# Patient Record
Sex: Male | Born: 1995
Health system: Southern US, Community
[De-identification: ages and names within clinical notes are randomized; demographics above are authoritative.]

## PROBLEM LIST (undated history)

## (undated) DIAGNOSIS — F419 Anxiety disorder, unspecified: Secondary | ICD-10-CM

## (undated) DIAGNOSIS — N200 Calculus of kidney: Secondary | ICD-10-CM

## (undated) HISTORY — DX: Anxiety disorder, unspecified: F41.9

## (undated) HISTORY — PX: WISDOM TOOTH EXTRACTION: SHX21

---
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
## 2016-05-15 ENCOUNTER — Emergency Department (INDEPENDENT_AMBULATORY_CARE_PROVIDER_SITE_OTHER)
Admission: EM | Admit: 2016-05-15 | Discharge: 2016-05-15 | Disposition: A | Discharge status: Home / Self Care | Payer: Self-pay | Source: Home / Self Care | Attending: Family Medicine | Admitting: Family Medicine

## 2016-05-15 ENCOUNTER — Telehealth: Payer: Self-pay | Admitting: *Deleted

## 2016-05-15 ENCOUNTER — Encounter: Payer: Self-pay | Admitting: *Deleted

## 2016-05-15 DIAGNOSIS — R300 Dysuria: Secondary | ICD-10-CM

## 2016-05-15 LAB — POCT URINALYSIS DIP (MANUAL ENTRY)
Glucose, UA: NEGATIVE
Ketones, POC UA: NEGATIVE
Leukocytes, UA: NEGATIVE
Nitrite, UA: NEGATIVE
PROTEIN UA: NEGATIVE
RBC UA: NEGATIVE
Spec Grav, UA: 1.025 (ref 1.005–1.03)
UROBILINOGEN UA: 0.2 (ref 0–1)
pH, UA: 5.5 (ref 5–8)

## 2016-05-15 MED ORDER — SULFAMETHOXAZOLE-TRIMETHOPRIM 800-160 MG PO TABS: 1.0000 | ORAL_TABLET | Freq: Two times a day (BID) | ORAL | 0 refills | Status: DC

## 2016-05-15 MED ORDER — CIPROFLOXACIN HCL 500 MG PO TABS: 500.0000 mg | ORAL_TABLET | Freq: Two times a day (BID) | ORAL | 0 refills | Status: DC

## 2016-05-15 NOTE — Discharge Instructions (Signed)
Increase fluid intake. May use non-prescription AZO for about two days, if desired, to decrease urinary discomfort.  If symptoms become significantly worse during the night or over the weekend, proceed to the local emergency room.  

## 2016-05-15 NOTE — ED Triage Notes (Signed)
Patient c/o 2 days of dysuria and polyuria x 2 days. He initially had flank pain that has now resolved. Taken tylenol otc. H/o kidney stones x 1. Denies concern for STD as it was 6 months ago he was sexually active.

## 2016-05-15 NOTE — Telephone Encounter (Signed)
Pt called reports that he realized that he was allergic to bactrim when he was little and request a different ABT. Per dr Cathren HarshBeese sent in Rx for Cipro. Pt notified.

## 2016-05-15 NOTE — ED Provider Notes (Signed)
[facility]    CSN: [id number] Arrival date & time: 05/15/16  1059     History   Chief Complaint Chief Complaint  Patient presents with  . Dysuria  . Polyuria    HPI Timothy Montes is a 21 y.o. male.   Patient complains of two day history of urinary frequency and mild dysuria, without urethral discharge.  He has noted some mild tenderness in his right testicle.  No abdominal pain.  No fevers, chills, and sweats.  He states that he has not been sexually active for about 6 months, and is not concerned about a possible STD.  He initially had left side left flank pain one week ago that resolved.  He has past history of kidney stone about 1.5 years ago.     The history is provided by the patient.  Dysuria  This is a new problem. The current episode started 2 days ago. The problem occurs constantly. The problem has not changed since onset.Pertinent negatives include no abdominal pain. Nothing aggravates the symptoms. Nothing relieves the symptoms. He has tried nothing for the symptoms.    History reviewed. No pertinent past medical history.  There are no active problems to display for this patient.   History reviewed. No pertinent surgical history.     Home Medications    Prior to Admission medications   Medication Sig Start Date End Date Taking? Authorizing Provider  FLUoxetine (PROZAC) 10 MG tablet Take 10 mg by mouth daily.   Yes Historical Provider, MD  omeprazole (PRILOSEC) 20 MG capsule Take 20 mg by mouth daily.   Yes Historical Provider, MD  sulfamethoxazole-trimethoprim (BACTRIM DS,SEPTRA DS) 800-160 MG tablet Take 1 tablet by mouth 2 (two) times daily. 05/15/16   Lattie Haw, MD    Family History Family History  Problem Relation Age of Onset  . Heart attack Other     Social History Social History  Substance Use Topics  . Smoking status: Current Every Day Smoker    Packs/day: 0.50  . Smokeless tobacco: Never Used  . Alcohol use No      Allergies   Patient has no known allergies.   Review of Systems Review of Systems  Constitutional: Negative for appetite change, chills, diaphoresis, fatigue and fever.  Gastrointestinal: Negative for abdominal pain.  Genitourinary: Positive for dysuria, flank pain, frequency and testicular pain. Negative for difficulty urinating, discharge, genital sores, hematuria, penile pain, penile swelling, scrotal swelling and urgency.  Skin: Negative for rash.  All other systems reviewed and are negative.    Physical Exam Triage Vital Signs ED Triage Vitals  Enc Vitals Group     BP 05/15/16 1133 128/82     Pulse Rate 05/15/16 1133 89     Resp --      Temp 05/15/16 1133 98 F (36.7 C)     Temp Source 05/15/16 1133 Oral     SpO2 05/15/16 1133 97 %     Weight 05/15/16 1133 207 lb (93.9 kg)     Height --      Head Circumference --      Peak Flow --      Pain Score 05/15/16 1137 0     Pain Loc --      Pain Edu? --      Excl. in GC? --    No data found.   Updated Vital Signs BP 128/82 (BP Location: Left Arm)   Pulse 89   Temp 98 F (36.7 C) (Oral)  Wt 207 lb (93.9 kg)   SpO2 97%   BMI 28.07 kg/m   Visual Acuity Right Eye Distance:   Left Eye Distance:   Bilateral Distance:    Right Eye Near:   Left Eye Near:    Bilateral Near:     Physical Exam Nursing notes and Vital Signs reviewed. Appearance:  Patient appears stated age, and in no acute distress Eyes:  Pupils are equal, round, and reactive to light and accomodation.  Extraocular movement is intact.  Conjunctivae are not inflamed  Ears:  Normal externally. Nose: Normal.    Pharynx:  Normal Neck:  Supple.   No adenopathy. Lungs:  Clear to auscultation.  Breath sounds are equal.  Moving air well. Heart:  Regular rate and rhythm without murmurs, rubs, or gallops.  Abdomen:  Nontender without masses or hepatosplenomegaly.  Bowel sounds are present.  No CVA or flank tenderness.  Extremities:  No edema.   Skin:  No rash present.   Genitourinary:  Penis normal without lesions or urethral discharge.  Scrotum is normal.  Testes are descended bilaterally without nodules or tenderness.  No hernias are palpated.  No regional lymphadenopathy palpated  UC Treatments / Results  Labs (all labs ordered are listed, but only abnormal results are displayed) Labs Reviewed  POCT URINALYSIS DIP (MANUAL ENTRY) - Abnormal; Notable for the following:       Result Value   Bilirubin, UA small (*)    All other components within normal limits  URINE CULTURE    EKG  EKG Interpretation None       Radiology No results found.  Procedures Procedures (including critical care time)  Medications Ordered in UC Medications - No data to display   Initial Impression / Assessment and Plan / UC Course  I have reviewed the triage vital signs and the nursing notes.  Pertinent labs & imaging results that were available during my care of the patient were reviewed by me and considered in my medical decision making (see chart for details).  Clinical Course   Urine culture pending. Normal exam reassuring. Begin empiric Bactrim DS BID Increase fluid intake.  May use non-prescription AZO for about two days, if desired, to decrease urinary discomfort.  If symptoms become significantly worse during the night or over the weekend, proceed to the local emergency room.  Followup with Family Doctor if not improved in one week.      Final Clinical Impressions(s) / UC Diagnoses   Final diagnoses:  Dysuria    New Prescriptions New Prescriptions   SULFAMETHOXAZOLE-TRIMETHOPRIM (BACTRIM DS,SEPTRA DS) 800-160 MG TABLET    Take 1 tablet by mouth 2 (two) times daily.     Lattie HawStephen A Makaylie Dedeaux, MD 06/04/16 2106

## 2016-05-16 ENCOUNTER — Emergency Department (INDEPENDENT_AMBULATORY_CARE_PROVIDER_SITE_OTHER)
Admission: EM | Admit: 2016-05-16 | Discharge: 2016-05-16 | Disposition: A | Discharge status: Home / Self Care | Payer: Self-pay | Source: Home / Self Care | Attending: Family Medicine | Admitting: Family Medicine

## 2016-05-16 DIAGNOSIS — R21 Rash and other nonspecific skin eruption: Secondary | ICD-10-CM

## 2016-05-16 LAB — URINE CULTURE: ORGANISM ID, BACTERIA: NO GROWTH

## 2016-05-16 MED ORDER — CLOTRIMAZOLE 1 % EX CREA: TOPICAL_CREAM | CUTANEOUS | 1 refills | Status: DC

## 2016-05-16 NOTE — ED Triage Notes (Signed)
Timothy Montes was seen here at the Urgent Care yesterday with dysuria. Today he noticed a rash in his groin area. The rash is reported as itchy, denies pain. He does have pain at the end of her penis and when he urinates.

## 2016-05-16 NOTE — ED Provider Notes (Signed)
CSN: [id number]     Arrival date & time 05/16/16  1537 History   First MD Initiated Contact with Patient 05/16/16 1549     Chief Complaint  Patient presents with  . Rash    x 1 day   (Consider location/radiation/quality/duration/timing/severity/associated sxs/prior Treatment) HPI Timothy Montes is a 21 y.o. male presenting to UC with c/o itching burning rash at the tip of his penis as well as bilateral groin that started last night and this morning. [facility], dx with a UTI, and discharged with Cipro as urine culture is pending.  Denies abdominal pain, n/v/d. Denies concern for STDs. Denies new soaps or lotions.   No past medical history on file. No past surgical history on file. Family History  Problem Relation Age of Onset  . Heart attack Other    Social History  Substance Use Topics  . Smoking status: Current Every Day Smoker    Packs/day: 0.50  . Smokeless tobacco: Never Used  . Alcohol use No    Review of Systems  Gastrointestinal: Negative for abdominal pain, diarrhea, nausea and vomiting.  Genitourinary: Positive for dysuria and penile pain. Negative for discharge, frequency, hematuria and urgency.  Musculoskeletal: Negative for back pain and myalgias.  Skin: Positive for color change and rash. Negative for wound.    Allergies  Patient has no known allergies.  Home Medications   Prior to Admission medications   Medication Sig Start Date End Date Taking? Authorizing Provider  ciprofloxacin (CIPRO) 500 MG tablet Take 1 tablet (500 mg total) by mouth 2 (two) times daily. 05/15/16  Yes Lattie HawStephen A Beese, MD  FLUoxetine (PROZAC) 10 MG tablet Take 10 mg by mouth daily.   Yes Historical Provider, MD  omeprazole (PRILOSEC) 20 MG capsule Take 20 mg by mouth daily.   Yes Historical Provider, MD  clotrimazole (LOTRIMIN) 1 % cream Apply to affected area 2 times daily for up to 4 weeks 05/16/16   Junius FinnerErin O'Malley, PA-C   Meds Ordered and Administered this Visit   Medications - No data to display  BP 137/88 (BP Location: Left Arm)   Pulse 110   Temp 98.5 F (36.9 C) (Oral)   Ht 6' (1.829 m)   Wt 207 lb (93.9 kg)   SpO2 95%   BMI 28.07 kg/m  No data found.   Physical Exam  Constitutional: He is oriented to person, place, and time. He appears well-developed and well-nourished.  HENT:  Head: Normocephalic and atraumatic.  Eyes: EOM are normal.  Neck: Normal range of motion.  Cardiovascular: Normal rate.   Pulmonary/Chest: Effort normal.  Genitourinary: Right testis shows no mass, no swelling and no tenderness. Left testis shows no mass, no swelling and no tenderness. Penile erythema present. No discharge found.  Genitourinary Comments: Erythematous rash in Left and Right inguinal folds and tip of penis. No penile discharge.  Musculoskeletal: Normal range of motion.  Neurological: He is alert and oriented to person, place, and time.  Skin: Skin is warm and dry. Rash ( see GU exam) noted.  Psychiatric: He has a normal mood and affect. His behavior is normal.  Nursing note and vitals reviewed.   Urgent Care Course   Clinical Course     Procedures (including critical care time)  Labs Review Labs Reviewed - No data to display  Imaging Review No results found.   MDM   1. Rash of groin    Pt c/o itching burning rash for 1 day. Exam  c/w tinea cruris.  Rx: clotrimazole cream F/u with PCP in 1 week if not improving.     Junius Finner, PA-C 05/16/16 1712

## 2016-05-17 NOTE — Telephone Encounter (Signed)
Callback: Patient reports he is starting to improve. UCX result given and discussed.

## 2017-06-25 ENCOUNTER — Emergency Department (INDEPENDENT_AMBULATORY_CARE_PROVIDER_SITE_OTHER)
Admission: EM | Admit: 2017-06-25 | Discharge: 2017-06-25 | Disposition: A | Discharge status: Home / Self Care | Payer: BLUE CROSS/BLUE SHIELD | Source: Home / Self Care | Attending: Family Medicine | Admitting: Family Medicine

## 2017-06-25 ENCOUNTER — Encounter: Payer: Self-pay | Admitting: Emergency Medicine

## 2017-06-25 ENCOUNTER — Other Ambulatory Visit: Payer: Self-pay

## 2017-06-25 DIAGNOSIS — R69 Illness, unspecified: Secondary | ICD-10-CM | POA: Diagnosis not present

## 2017-06-25 DIAGNOSIS — J111 Influenza due to unidentified influenza virus with other respiratory manifestations: Secondary | ICD-10-CM

## 2017-06-25 MED ORDER — OSELTAMIVIR PHOSPHATE 75 MG PO CAPS: 75.0000 mg | ORAL_CAPSULE | Freq: Two times a day (BID) | ORAL | 0 refills | Status: DC

## 2017-06-25 NOTE — ED Triage Notes (Addendum)
Cough, sore throat, fever x 3 days 

## 2017-06-25 NOTE — ED Provider Notes (Signed)
[facility]    CSN: [id number] Arrival date & time: 06/25/17  1111     History   Chief Complaint Chief Complaint  Patient presents with  . Sore Throat    HPI Timothy Montes is a 22 y.o. male.   Complains of 2 day history flu-like illness including fever 101/chills, fatigue, sore throat, sinus congestion, and cough. Cough is non-productive and somewhat worse at night.  No pleuritic pain or shortness of breath.    The history is provided by the patient.    History reviewed. No pertinent past medical history.  There are no active problems to display for this patient.   History reviewed. No pertinent surgical history.     Home Medications    Prior to Admission medications   Medication Sig Start Date End Date Taking? Authorizing Provider  FLUoxetine (PROZAC) 10 MG tablet Take 10 mg by mouth daily.    [provider]  oseltamivir (TAMIFLU) 75 MG capsule Take 1 capsule (75 mg total) by mouth every 12 (twelve) hours. 06/25/17   Lattie HawBeese, Stephen A, MD    Family History Family History  Problem Relation Age of Onset  . Heart attack Other     Social History Social History   Tobacco Use  . Smoking status: Former Smoker    Packs/day: 0.00  . Smokeless tobacco: Never Used  Substance Use Topics  . Alcohol use: No  . Drug use: No     Allergies   Sulfa antibiotics   Review of Systems Review of Systems + sore throat + cough No pleuritic pain No wheezing + nasal congestion + post-nasal drainage No sinus pain/pressure No itchy/red eyes ? earache No hemoptysis No SOB No fever, + chills No nausea No vomiting No abdominal pain No diarrhea No urinary symptoms No skin rash + fatigue No myalgias + headache Used OTC meds without relief   Physical Exam Triage Vital Signs ED Triage Vitals  Enc Vitals Group     BP 06/25/17 1224 128/78     Pulse Rate 06/25/17 1224 (!) 116     Resp --      Temp 06/25/17 1224 98.9 F (37.2 C)   Temp Source 06/25/17 1224 Oral     SpO2 06/25/17 1224 96 %     Weight 06/25/17 1230 209 lb (94.8 kg)     Height 06/25/17 1230 6' (1.829 m)     Head Circumference --      Peak Flow --      Pain Score --      Pain Loc --      Pain Edu? --      Excl. in GC? --    No data found.  Updated Vital Signs BP 128/78 (BP Location: Right Arm)   Pulse (!) 116   Temp 98.9 F (37.2 C) (Oral)   Ht 6' (1.829 m)   Wt 209 lb (94.8 kg)   SpO2 96%   BMI 28.35 kg/m   Visual Acuity Right Eye Distance:   Left Eye Distance:   Bilateral Distance:    Right Eye Near:   Left Eye Near:    Bilateral Near:     Physical Exam Nursing notes and Vital Signs reviewed. Appearance:  Patient appears stated age, and in no acute distress Eyes:  Pupils are equal, round, and reactive to light and accomodation.  Extraocular movement is intact.  Conjunctivae are not inflamed  Ears:  Canals normal.  Tympanic membranes normal.  Nose:  Mildly congested  turbinates.  No sinus tenderness.  Pharynx:  Normal Neck:  Supple.  Enlarged posterior/lateral nodes are palpated bilaterally, tender to palpation on the left.   Lungs:  Clear to auscultation.  Breath sounds are equal.  Moving air well. Heart:  Regular rate and rhythm without murmurs, rubs, or gallops.  Abdomen:  Nontender without masses or hepatosplenomegaly.  Bowel sounds are present.  No CVA or flank tenderness.  Extremities:  No edema.  Skin:  No rash present.    UC Treatments / Results  Labs (all labs ordered are listed, but only abnormal results are displayed) Labs Reviewed - No data to display  EKG  EKG Interpretation None       Radiology No results found.  Procedures Procedures (including critical care time)  Medications Ordered in UC Medications - No data to display   Initial Impression / Assessment and Plan / UC Course  I have reviewed the triage vital signs and the nursing notes.  Pertinent labs & imaging results that were available  during my care of the patient were reviewed by me and considered in my medical decision making (see chart for details).    Begin Tamiflu. Take plain guaifenesin (1200mg  extended release tabs such as Mucinex) twice daily, with plenty of water, for cough and congestion.  May add Pseudoephedrine (30mg , one or two every 4 to 6 hours) for sinus congestion.  Get adequate rest.   May use Afrin nasal spray (or generic oxymetazoline) each morning for about 5 days and then discontinue.  Also recommend using saline nasal spray several times daily and saline nasal irrigation (AYR is a common brand).  Use Flonase nasal spray each morning after using Afrin nasal spray and saline nasal irrigation. Try warm salt water gargles for sore throat.  Stop all antihistamines for now, and other non-prescription cough/cold preparations. May take Ibuprofen 200mg , 4 tabs every 8 hours with food for body aches, fever, etc. May take Delsym Cough Suppressant at bedtime for nighttime cough.  Followup with Family Doctor if not improved in one week.     Final Clinical Impressions(s) / UC Diagnoses   Final diagnoses:  Influenza-like illness    ED Discharge Orders        Ordered    oseltamivir (TAMIFLU) 75 MG capsule  Every 12 hours     06/25/17 1318          Lattie Haw, MD 07/06/17 1236

## 2017-06-25 NOTE — Discharge Instructions (Signed)
Take plain guaifenesin (1200mg extended release tabs such as Mucinex) twice daily, with plenty of water, for cough and congestion.  May add Pseudoephedrine (30mg, one or two every 4 to 6 hours) for sinus congestion.  Get adequate rest.   °May use Afrin nasal spray (or generic oxymetazoline) each morning for about 5 days and then discontinue.  Also recommend using saline nasal spray several times daily and saline nasal irrigation (AYR is a common brand).  Use Flonase nasal spray each morning after using Afrin nasal spray and saline nasal irrigation. °Try warm salt water gargles for sore throat.  °Stop all antihistamines for now, and other non-prescription cough/cold preparations. °May take Ibuprofen 200mg, 4 tabs every 8 hours with food for body aches, fever, etc. °May take Delsym Cough Suppressant at bedtime for nighttime cough.  °

## 2017-12-19 IMAGING — DX DG ABDOMEN 1V
2 series · 2 of 2 positions shown · non-contrast
Comparison: No priors.

CLINICAL DATA: 19-year-old male with constipation for the past 5
days. Distended abdomen. Abdominal pain. Decreased appetite.

EXAM:
ABDOMEN - 1 VIEW

[abdomen kub (1 of 2)]
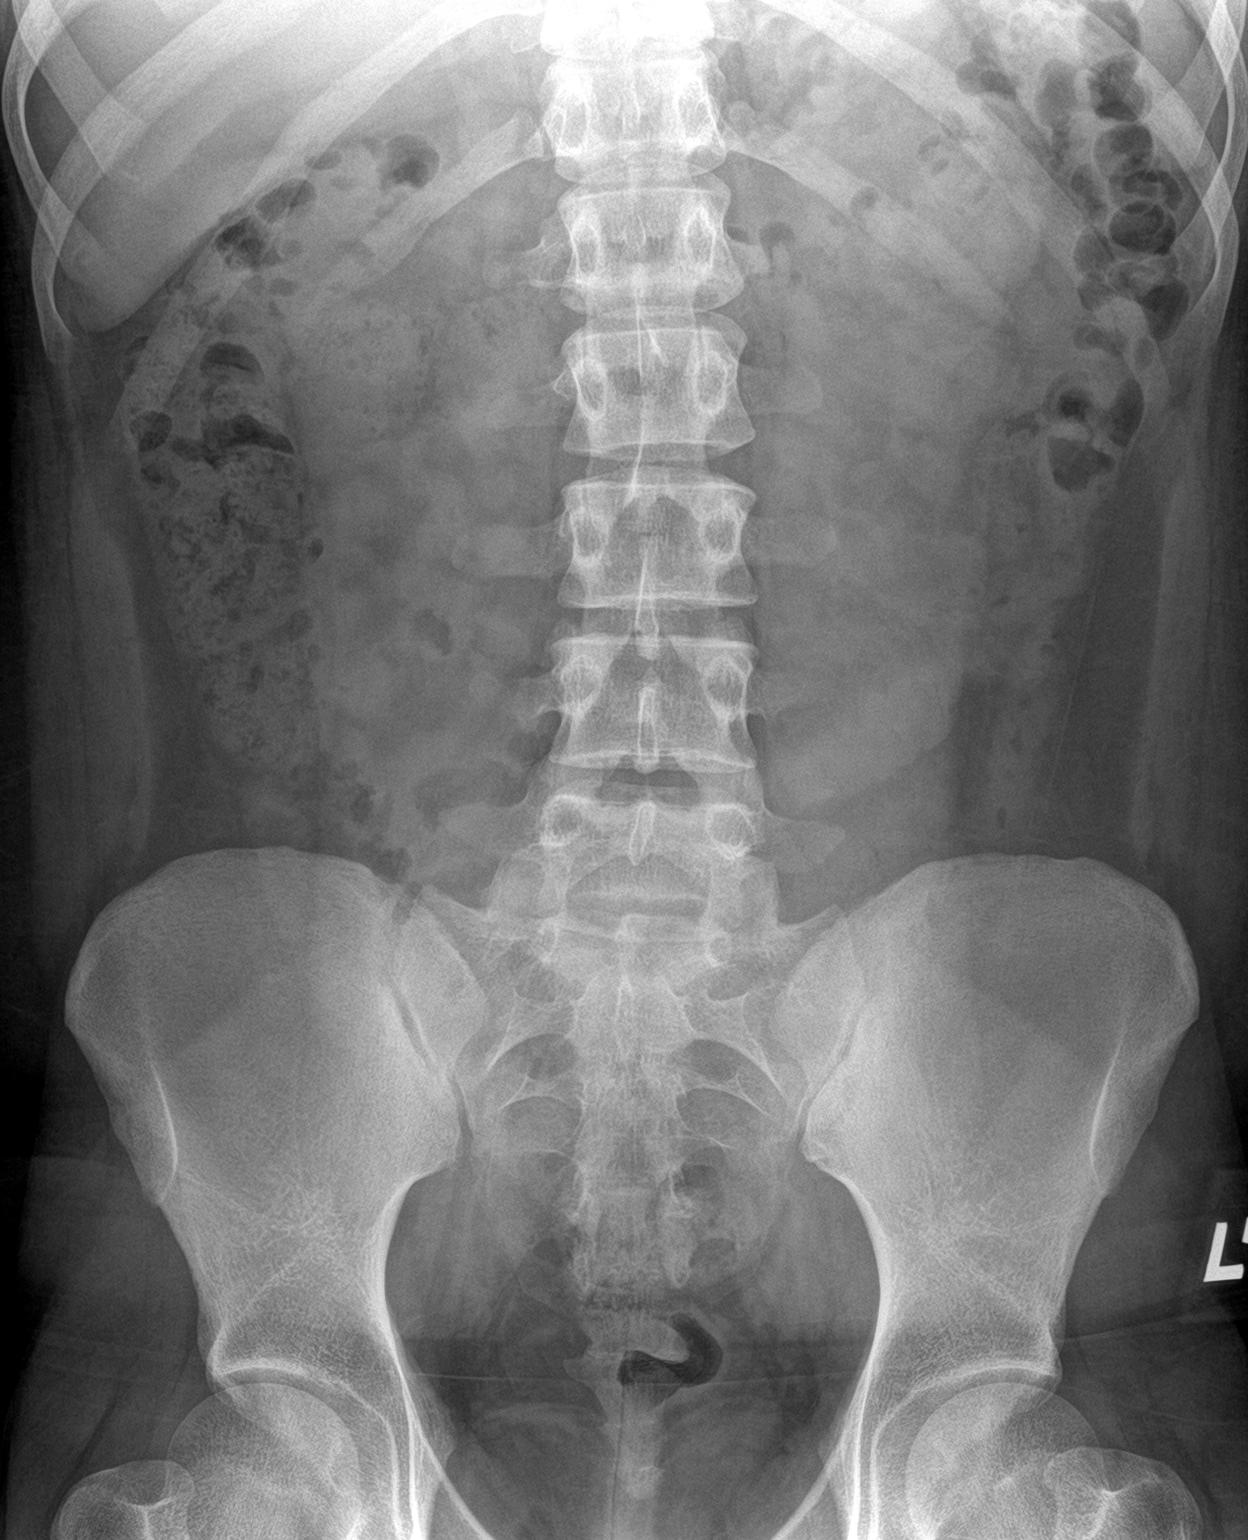

[abdomen kub (2 of 2)]
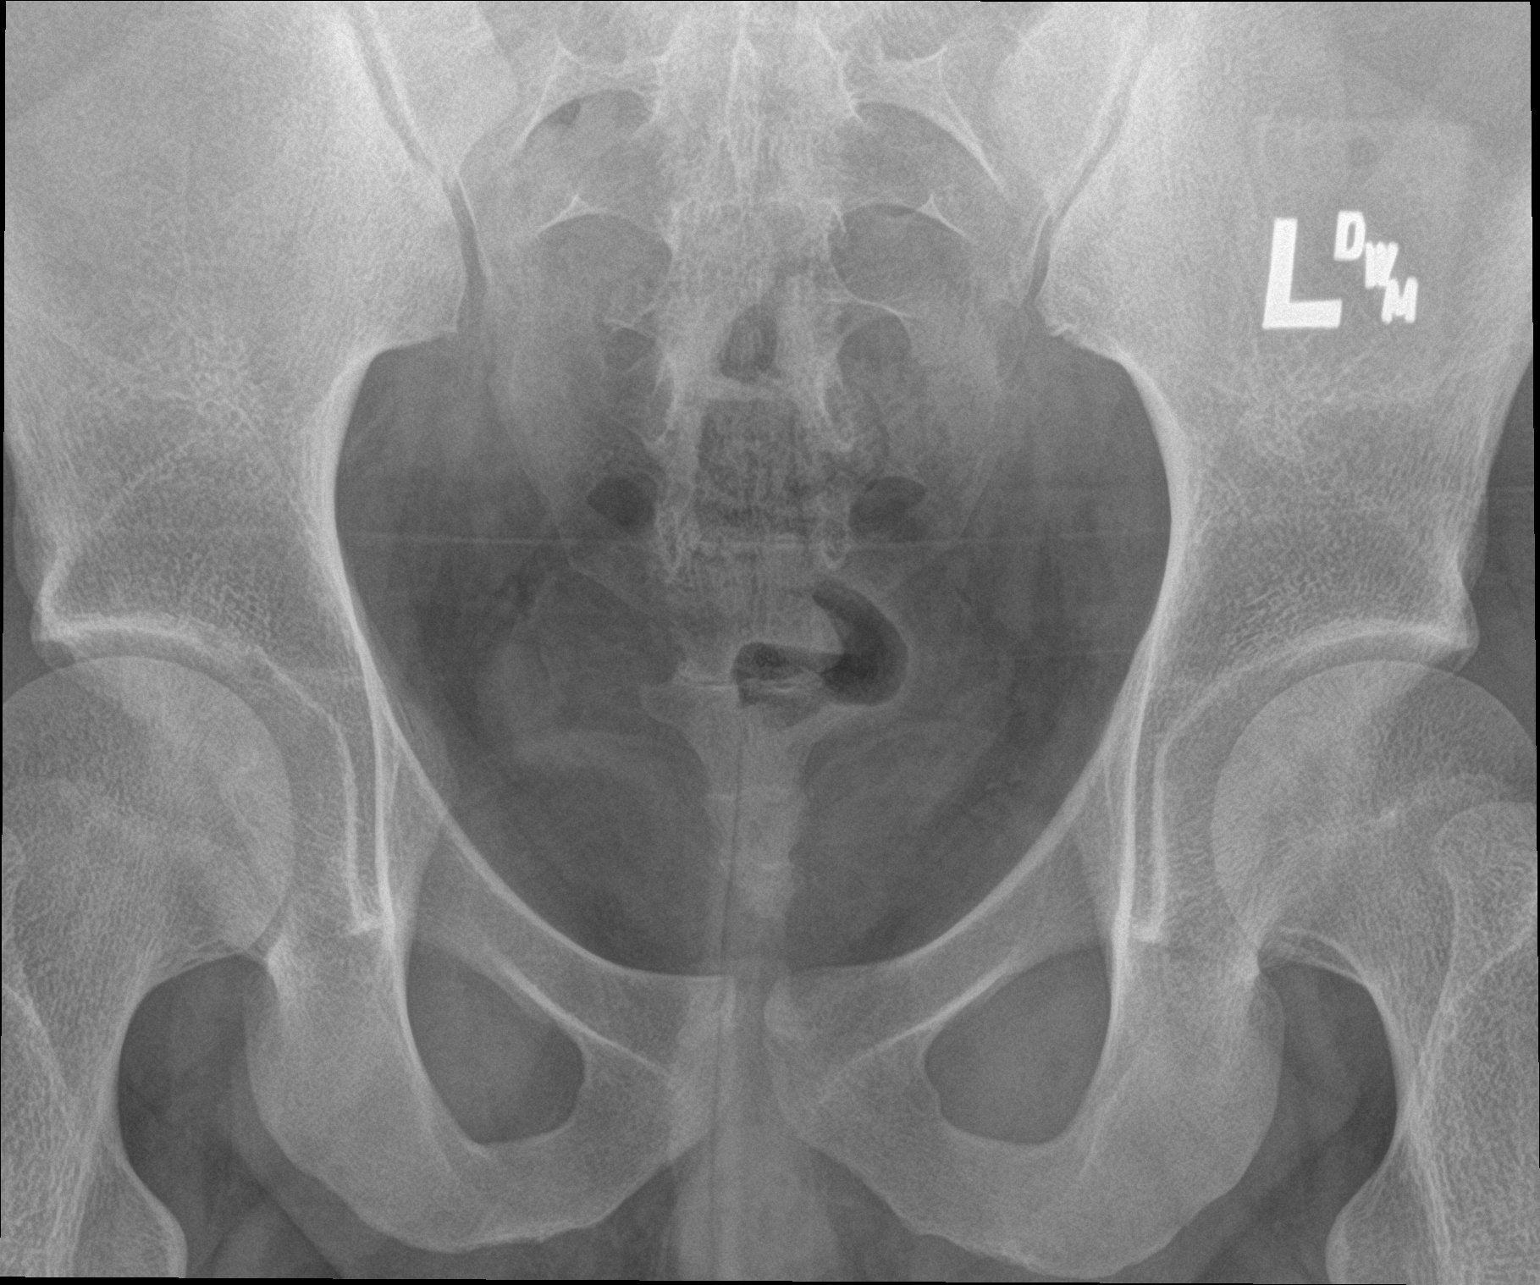

[2 of 2 positions shown; findings below may reference images not displayed]

FINDINGS: Gas and stool are seen scattered throughout the colon extending to
the level of the distal rectum. No pathologic distension of small
bowel is noted. No gross evidence of pneumoperitoneum.
IMPRESSION: 1. Nonobstructive bowel gas pattern.
2. No pneumoperitoneum.
3. Stool burden does not appear excessive.

## 2019-06-06 DIAGNOSIS — F419 Anxiety disorder, unspecified: Secondary | ICD-10-CM | POA: Diagnosis not present

## 2019-06-06 DIAGNOSIS — Z79899 Other long term (current) drug therapy: Secondary | ICD-10-CM | POA: Diagnosis not present

## 2019-06-06 DIAGNOSIS — Z882 Allergy status to sulfonamides status: Secondary | ICD-10-CM | POA: Diagnosis not present

## 2019-06-06 DIAGNOSIS — N132 Hydronephrosis with renal and ureteral calculous obstruction: Secondary | ICD-10-CM | POA: Diagnosis not present

## 2019-06-06 DIAGNOSIS — N200 Calculus of kidney: Secondary | ICD-10-CM | POA: Diagnosis not present

## 2019-06-06 DIAGNOSIS — Z87442 Personal history of urinary calculi: Secondary | ICD-10-CM | POA: Diagnosis not present

## 2019-06-06 DIAGNOSIS — R112 Nausea with vomiting, unspecified: Secondary | ICD-10-CM | POA: Diagnosis not present

## 2019-06-06 DIAGNOSIS — R350 Frequency of micturition: Secondary | ICD-10-CM | POA: Diagnosis not present

## 2019-06-06 DIAGNOSIS — F17210 Nicotine dependence, cigarettes, uncomplicated: Secondary | ICD-10-CM | POA: Diagnosis not present

## 2019-06-06 DIAGNOSIS — R109 Unspecified abdominal pain: Secondary | ICD-10-CM | POA: Diagnosis not present

## 2019-06-06 DIAGNOSIS — M549 Dorsalgia, unspecified: Secondary | ICD-10-CM | POA: Diagnosis not present

## 2019-12-31 ENCOUNTER — Ambulatory Visit (INDEPENDENT_AMBULATORY_CARE_PROVIDER_SITE_OTHER): Payer: BC Managed Care – PPO | Admitting: Family Medicine

## 2019-12-31 ENCOUNTER — Encounter: Payer: Self-pay | Admitting: Family Medicine

## 2019-12-31 DIAGNOSIS — F418 Other specified anxiety disorders: Secondary | ICD-10-CM

## 2019-12-31 MED ORDER — FLUOXETINE HCL 40 MG PO CAPS: 40.0000 mg | ORAL_CAPSULE | Freq: Every day | ORAL | 1 refills | Status: DC

## 2019-12-31 NOTE — Progress Notes (Signed)
Timothy Montes - 24 y.o. male MRN [id number]  Date of birth: May 05, 1996  Subjective Chief Complaint  Patient presents with  . Establish Care    HPI Timothy Montes is a 24 y.o. male here today for initial visit.  He has a history of depression and anxiety.  He was seen by [facility] in [place] since he was about 18 and treated with fluoxextine.  He reports that initially had symptoms of depression but now this is more anxiety predominant.  He has been unable to get back in with [facility] and would like for me to prescribe this for him.  He reports that current dose of fluoxetine isn't as effective as when he initially started this.    No flowsheet data found.  No flowsheet data found.   ROS:  A comprehensive ROS was completed and negative except as noted per HPI    Allergies  Allergen Reactions  . Sulfa Antibiotics     Past Medical History:  Diagnosis Date  . Anxiety     Past Surgical History:  Procedure Laterality Date  . WISDOM TOOTH EXTRACTION      Social History   Socioeconomic History  . Marital status: Single    Spouse name: Not on file  . Number of children: Not on file  . Years of education: Not on file  . Highest education level: Not on file  Occupational History  . Not on file  Tobacco Use  . Smoking status: Current Every Day Smoker    Packs/day: 0.50    Years: 6.00    Pack years: 3.00    Types: Cigarettes  . Smokeless tobacco: Never Used  Vaping Use  . Vaping Use: Never used  Substance and Sexual Activity  . Alcohol use: Yes    Alcohol/week: 2.0 standard drinks    Types: 2 Cans of beer per week    Comment: weekly  . Drug use: No  . Sexual activity: Yes    Partners: Female  Other Topics Concern  . Not on file  Social History Narrative  . Not on file   Social Determinants of Health   Financial Resource Strain:   . Difficulty of Paying Living Expenses: Not on file  Food Insecurity:   . Worried About Programme researcher, broadcasting/film/video in the Last Year:  Not on file  . Ran Out of Food in the Last Year: Not on file  Transportation Needs:   . Lack of Transportation (Medical): Not on file  . Lack of Transportation (Non-Medical): Not on file  Physical Activity:   . Days of Exercise per Week: Not on file  . Minutes of Exercise per Session: Not on file  Stress:   . Feeling of Stress : Not on file  Social Connections:   . Frequency of Communication with Friends and Family: Not on file  . Frequency of Social Gatherings with Friends and Family: Not on file  . Attends Religious Services: Not on file  . Active Member of Clubs or Organizations: Not on file  . Attends Banker Meetings: Not on file  . Marital Status: Not on file    Family History  Problem Relation Age of Onset  . Heart attack Other   . Dementia Maternal Grandmother   . Thyroid disease Maternal Grandmother     Health Maintenance  Topic Date Due  . Hepatitis C Screening  Never done  . COVID-19 Vaccine (1) Never done  . HIV Screening  Never done  .  TETANUS/TDAP  12/16/2017  . INFLUENZA VACCINE  12/13/2019     ----------------------------------------------------------------------------------------------------------------------------------------------------------------------------------------------------------------- Physical Exam BP (!) 148/82 (BP Location: Left Arm, Patient Position: Sitting, Cuff Size: Normal)   Pulse 85   Temp 98.4 F (36.9 C) (Oral)   Wt 212 lb 11.2 oz (96.5 kg)   SpO2 99%   BMI 28.85 kg/m   Physical Exam Constitutional:      Appearance: Normal appearance.  HENT:     Head: Normocephalic and atraumatic.  Eyes:     General: No scleral icterus. Cardiovascular:     Rate and Rhythm: Normal rate and regular rhythm.  Musculoskeletal:     Cervical back: Neck supple.  Skin:    General: Skin is warm and dry.  Neurological:     General: No focal deficit present.     Mental Status: He is alert.      ------------------------------------------------------------------------------------------------------------------------------------------------------------------------------------------------------------------- Assessment and Plan  Depression with anxiety He will continue fluoxetine but will increase to 40mg  daily as he feels that current dose isn't as effective as it had been previously.  I will plan to see him back in about 2 months.    Meds ordered this encounter  Medications  . FLUoxetine (PROZAC) 40 MG capsule    Sig: Take 1 capsule (40 mg total) by mouth daily.    Dispense:  90 capsule    Refill:  1    Return in about 2 months (around 03/01/2020) for Depression/anxiety.    This visit occurred during the SARS-CoV-2 public health emergency.  Safety protocols were in place, including screening questions prior to the visit, additional usage of staff PPE, and extensive cleaning of exam room while observing appropriate contact time as indicated for disinfecting solutions.

## 2019-12-31 NOTE — Patient Instructions (Signed)
Try increasing fluoxetine to 40mg .  Follow up with me in about 2 months

## 2019-12-31 NOTE — Assessment & Plan Note (Signed)
He will continue fluoxetine but will increase to 40mg  daily as he feels that current dose isn't as effective as it had been previously.  I will plan to see him back in about 2 months.

## 2020-02-29 ENCOUNTER — Emergency Department (INDEPENDENT_AMBULATORY_CARE_PROVIDER_SITE_OTHER)
Admission: RE | Admit: 2020-02-29 | Discharge: 2020-02-29 | Disposition: A | Discharge status: Home / Self Care | Payer: BC Managed Care – PPO | Source: Ambulatory Visit

## 2020-02-29 ENCOUNTER — Other Ambulatory Visit: Payer: Self-pay

## 2020-02-29 ENCOUNTER — Emergency Department (INDEPENDENT_AMBULATORY_CARE_PROVIDER_SITE_OTHER): Payer: BC Managed Care – PPO

## 2020-02-29 VITALS — BP 130/86 | HR 76 | Temp 98.9°F | Resp 16

## 2020-02-29 DIAGNOSIS — R3989 Other symptoms and signs involving the genitourinary system: Secondary | ICD-10-CM

## 2020-02-29 DIAGNOSIS — N200 Calculus of kidney: Secondary | ICD-10-CM | POA: Diagnosis not present

## 2020-02-29 DIAGNOSIS — R300 Dysuria: Secondary | ICD-10-CM

## 2020-02-29 DIAGNOSIS — R590 Localized enlarged lymph nodes: Secondary | ICD-10-CM

## 2020-02-29 HISTORY — DX: Calculus of kidney: N20.0

## 2020-02-29 LAB — POCT URINALYSIS DIP (MANUAL ENTRY)
Bilirubin, UA: NEGATIVE
Blood, UA: NEGATIVE
Glucose, UA: NEGATIVE mg/dL
Ketones, POC UA: NEGATIVE mg/dL
Leukocytes, UA: NEGATIVE
Nitrite, UA: NEGATIVE
Protein Ur, POC: NEGATIVE mg/dL
Spec Grav, UA: 1.025 (ref 1.010–1.025)
Urobilinogen, UA: 0.2 E.U./dL
pH, UA: 7 (ref 5.0–8.0)

## 2020-02-29 MED ORDER — TAMSULOSIN HCL 0.4 MG PO CAPS: 0.4000 mg | ORAL_CAPSULE | Freq: Every day | ORAL | 0 refills | Status: DC

## 2020-02-29 NOTE — ED Provider Notes (Signed)
[facility]    CSN: [id number] Arrival date & time: 02/29/20  1042      History   Chief Complaint Chief Complaint  Patient presents with  . Dysuria    sharp lower abdominal pain    HPI Timothy Montes is a 24 y.o. male.   HPI  Timothy Montes is a 24 y.o. male presenting to UC with c/o dysuria and sharp lower abdominal pain for 3 days. Hx of kidney stones. Denies fever, chills, n/v/d. He called a urologist who recommended he be evaluated in UC today and to call them back to schedule f/u appointment with urology.  Pt denies concern for STI.  States he has done well with Flomax in the past. Not currently on medication.    Past Medical History:  Diagnosis Date  . Anxiety   . Kidney stone     Patient Active Problem List   Diagnosis Date Noted  . Depression with anxiety 12/31/2019  . Right ureteral stone 01/27/2015    Past Surgical History:  Procedure Laterality Date  . WISDOM TOOTH EXTRACTION         Home Medications    Prior to Admission medications   Medication Sig Start Date End Date Taking? Authorizing Provider  FLUoxetine (PROZAC) 40 MG capsule Take 1 capsule (40 mg total) by mouth daily. 12/31/19  Yes Everrett Coombe, DO  tamsulosin (FLOMAX) 0.4 MG CAPS capsule Take 1 capsule (0.4 mg total) by mouth daily. 02/29/20   Lurene Shadow, PA-C    Family History Family History  Problem Relation Age of Onset  . Healthy Mother   . Hypertension Father   . Heart attack Other   . Dementia Maternal Grandmother   . Thyroid disease Maternal Grandmother     Social History Social History   Tobacco Use  . Smoking status: Former Smoker    Packs/day: 0.50    Years: 6.00    Pack years: 3.00    Types: Cigarettes    Quit date: 02/06/2020    Years since quitting: 0.0  . Smokeless tobacco: Never Used  Vaping Use  . Vaping Use: Never used  Substance Use Topics  . Alcohol use: Yes    Alcohol/week: 2.0 standard drinks    Types: 2 Cans of beer per week     Comment: weekly  . Drug use: No     Allergies   Sulfa antibiotics   Review of Systems Review of Systems  Constitutional: Negative for chills and fever.  Gastrointestinal: Positive for abdominal pain (bladder pain ). Negative for diarrhea, nausea and vomiting.  Genitourinary: Positive for dysuria. Negative for frequency and hematuria.  Musculoskeletal: Positive for back pain (mild right side). Negative for myalgias.     Physical Exam Triage Vital Signs ED Triage Vitals  Enc Vitals Group     BP 02/29/20 1132 130/86     Pulse Rate 02/29/20 1132 76     Resp 02/29/20 1132 16     Temp 02/29/20 1132 98.9 F (37.2 C)     Temp Source 02/29/20 1132 Oral     SpO2 02/29/20 1132 98 %     Weight --      Height --      Head Circumference --      Peak Flow --      Pain Score 02/29/20 1128 0     Pain Loc --      Pain Edu? --      Excl. in GC? --  No data found.  Updated Vital Signs BP 130/86 (BP Location: Right Arm)   Pulse 76   Temp 98.9 F (37.2 C) (Oral)   Resp 16   SpO2 98%   Visual Acuity Right Eye Distance:   Left Eye Distance:   Bilateral Distance:    Right Eye Near:   Left Eye Near:    Bilateral Near:     Physical Exam Vitals and nursing note reviewed.  Constitutional:      Appearance: Normal appearance. He is well-developed.  HENT:     Head: Normocephalic and atraumatic.  Cardiovascular:     Rate and Rhythm: Normal rate and regular rhythm.  Pulmonary:     Effort: Pulmonary effort is normal. No respiratory distress.     Breath sounds: Normal breath sounds.  Abdominal:     General: There is no distension.     Palpations: Abdomen is soft.     Tenderness: There is no abdominal tenderness. There is no right CVA tenderness or left CVA tenderness.  Musculoskeletal:        General: Normal range of motion.     Cervical back: Normal range of motion.  Skin:    General: Skin is warm and dry.  Neurological:     Mental Status: He is alert and oriented to  person, place, and time.  Psychiatric:        Behavior: Behavior normal.      UC Treatments / Results  Labs (all labs ordered are listed, but only abnormal results are displayed) Labs Reviewed  POCT URINALYSIS DIP (MANUAL ENTRY)    EKG   Radiology CT Renal Stone Study  Result Date: 02/29/2020 CLINICAL DATA:  Bladder pain, flank pain EXAM: CT ABDOMEN AND PELVIS WITHOUT CONTRAST TECHNIQUE: Multidetector CT imaging of the abdomen and pelvis was performed following the standard protocol without IV contrast. COMPARISON:  None. FINDINGS: Lower chest: No acute abnormality. Hepatobiliary: No focal liver abnormality is seen. No gallstones, gallbladder wall thickening, or biliary dilatation. Pancreas: Unremarkable. No pancreatic ductal dilatation or surrounding inflammatory changes. Spleen: Normal in size without focal abnormality. Adrenals/Urinary Tract: Unremarkable adrenal glands. Tiny 1-2 mm nonobstructing right renal stone. No left-sided renal calculi. No hydronephrosis. Bilateral ureters are unremarkable. No ureteral stone. Urinary bladder is incompletely distended but appears otherwise unremarkable. Stomach/Bowel: Stomach is within normal limits. Appendix appears normal (series 2, image 59). No evidence of bowel wall thickening, distention, or inflammatory changes. Vascular/Lymphatic: There are a few mildly prominent mesenteric lymph nodes within the right lower quadrant, largest measuring up to 7 mm. No abdominopelvic lymphadenopathy. No vascular abnormality evident on non contrasted study. Reproductive: Nonenlarged prostate gland containing a few punctate calcifications. Other: No free fluid. No abdominopelvic fluid collection. No pneumoperitoneum. No abdominal wall hernia. Musculoskeletal: No acute or significant osseous findings. IMPRESSION: 1. Tiny 1-2 mm nonobstructing right renal stone. No hydronephrosis. 2. There are a few mildly prominent mesenteric lymph nodes within the right lower  quadrant, largest measuring up to 7 mm, which could reflect sequela of mesenteric adenitis. 3. Otherwise, no acute abdominopelvic findings. Normal appendix. Electronically Signed   By: Duanne Guess D.O.   On: 02/29/2020 12:21    Procedures Procedures (including critical care time)  Medications Ordered in UC Medications - No data to display  Initial Impression / Assessment and Plan / UC Course  I have reviewed the triage vital signs and the nursing notes.  Pertinent labs & imaging results that were available during my care of the patient were reviewed by  me and considered in my medical decision making (see chart for details).    Discussed imaging with pt Rx flomax Encouraged good hydration F/u with urology and PCP Discussed symptoms that warrant emergent care in the ED. AVS given  Final Clinical Impressions(s) / UC Diagnoses   Final diagnoses:  Dysuria  Bladder pain  Right renal stone  Mesenteric lymphadenopathy     Discharge Instructions      You may take the medication as prescribed to help with symptoms. Be sure to stay well hydrated.  You may take 500mg  acetaminophen every 4-6 hours or in combination with ibuprofen 400-600mg  every 6-8 hours as needed for pain.  Call to schedule a follow up appointment with primary care or a urologist later this week if not improving.   Call 911 or have someone drive you to the hospital if symptoms significantly worsening- including worsening pain, fever, vomiting, pain going into your Right lower belly or other new concerning symptoms develop.     ED Prescriptions    Medication Sig Dispense Auth. Provider   tamsulosin (FLOMAX) 0.4 MG CAPS capsule Take 1 capsule (0.4 mg total) by mouth daily. 30 capsule , Lurene Shadow     PDMP not reviewed this encounter.   [place], Lurene Shadow 02/29/20 2313

## 2020-02-29 NOTE — ED Triage Notes (Signed)
Pt presents to Urgent Care with c/o dysuria and sharp lower abdominal pain x 3 days. Has hx of kidney stones. Afebrile.

## 2020-02-29 NOTE — Discharge Instructions (Signed)
  You may take the medication as prescribed to help with symptoms. Be sure to stay well hydrated.  You may take 500mg  acetaminophen every 4-6 hours or in combination with ibuprofen 400-600mg  every 6-8 hours as needed for pain.  Call to schedule a follow up appointment with primary care or a urologist later this week if not improving.   Call 911 or have someone drive you to the hospital if symptoms significantly worsening- including worsening pain, fever, vomiting, pain going into your Right lower belly or other new concerning symptoms develop.

## 2020-03-01 ENCOUNTER — Ambulatory Visit: Payer: BC Managed Care – PPO | Admitting: Family Medicine

## 2020-03-02 ENCOUNTER — Ambulatory Visit (INDEPENDENT_AMBULATORY_CARE_PROVIDER_SITE_OTHER): Payer: BC Managed Care – PPO | Admitting: Family Medicine

## 2020-03-02 ENCOUNTER — Encounter: Payer: Self-pay | Admitting: Family Medicine

## 2020-03-02 VITALS — BP 135/71 | HR 92 | Temp 98.1°F | Wt 209.6 lb

## 2020-03-02 DIAGNOSIS — F418 Other specified anxiety disorders: Secondary | ICD-10-CM | POA: Diagnosis not present

## 2020-03-02 DIAGNOSIS — R1030 Lower abdominal pain, unspecified: Secondary | ICD-10-CM | POA: Insufficient documentation

## 2020-03-02 DIAGNOSIS — Z23 Encounter for immunization: Secondary | ICD-10-CM | POA: Diagnosis not present

## 2020-03-02 NOTE — Assessment & Plan Note (Signed)
Continued mild dysuria Previous UA normal.  No ureteral stones noted.  Check GC/Chlamydia.  He plans to follow up with urology as well if symptoms persist.

## 2020-03-02 NOTE — Progress Notes (Signed)
Timothy Montes - 24 y.o. male MRN [id number]  Date of birth: [birth year]  Subjective Chief Complaint  Patient presents with  . Depression    HPI Timothy Montes is a 24 y.o. male here today for follow up of depression and anxiety.  Since his last visit he was seen at Urgent care a couple of days ago with complaint of bladder pain and dysuria.  He had UA which was normal and CT showing R renal stones without obstruction.  No ureteral stones noted.  There was mesenteric adenopathy noted.   He was started on flomax and instructed to f/u with urology.  He reports that he continues to have dysuria.  Requests to have STD check.    Reports that depression and anxiety are doing well with fluoxetine.  He denies side effects from medication.    Depression screen [facility] 2/9 03/02/2020  Decreased Interest 0  Down, Depressed, Hopeless 0  PHQ - 2 Score 0  Altered sleeping 1  Tired, decreased energy 0  Change in appetite 0  Feeling bad or failure about yourself  0  Trouble concentrating 0  Moving slowly or fidgety/restless 0  Suicidal thoughts 0  PHQ-9 Score 1  Difficult doing work/chores Not difficult at all   GAD 7 : Generalized Anxiety Score 03/02/2020  Nervous, Anxious, on Edge 0  Control/stop worrying 0  Worry too much - different things 0  Trouble relaxing 0  Restless 0  Easily annoyed or irritable 1  Afraid - awful might happen 0  Total GAD 7 Score 1  Anxiety Difficulty Not difficult at all     Allergies  Allergen Reactions  . Sulfa Antibiotics     Past Medical History:  Diagnosis Date  . Anxiety   . Kidney stone     Past Surgical History:  Procedure Laterality Date  . WISDOM TOOTH EXTRACTION      Social History   Socioeconomic History  . Marital status: Single    Spouse name: Not on file  . Number of children: Not on file  . Years of education: Not on file  . Highest education level: Not on file  Occupational History  . Not on file  Tobacco Use  . Smoking  status: Former Smoker    Packs/day: 0.50    Years: 6.00    Pack years: 3.00    Types: Cigarettes    Quit date: 02/06/2020    Years since quitting: 0.0  . Smokeless tobacco: Never Used  Vaping Use  . Vaping Use: Never used  Substance and Sexual Activity  . Alcohol use: Yes    Alcohol/week: 2.0 standard drinks    Types: 2 Cans of beer per week    Comment: weekly  . Drug use: No  . Sexual activity: Yes    Partners: Female  Other Topics Concern  . Not on file  Social History Narrative  . Not on file   Social Determinants of Health   Financial Resource Strain:   . Difficulty of Paying Living Expenses: Not on file  Food Insecurity:   . Worried About Programme researcher, broadcasting/film/video in the Last Year: Not on file  . Ran Out of Food in the Last Year: Not on file  Transportation Needs:   . Lack of Transportation (Medical): Not on file  . Lack of Transportation (Non-Medical): Not on file  Physical Activity:   . Days of Exercise per Week: Not on file  . Minutes of Exercise per Session: Not on  file  Stress:   . Feeling of Stress : Not on file  Social Connections:   . Frequency of Communication with Friends and Family: Not on file  . Frequency of Social Gatherings with Friends and Family: Not on file  . Attends Religious Services: Not on file  . Active Member of Clubs or Organizations: Not on file  . Attends Banker Meetings: Not on file  . Marital Status: Not on file    Family History  Problem Relation Age of Onset  . Healthy Mother   . Hypertension Father   . Heart attack Other   . Dementia Maternal Grandmother   . Thyroid disease Maternal Grandmother     Health Maintenance  Topic Date Due  . Hepatitis C Screening  Never done  . HIV Screening  Never done  . TETANUS/TDAP  12/16/2017  . COVID-19 Vaccine (1) 06/14/2020 (Originally 11/11/2008)  . INFLUENZA VACCINE  Completed      ----------------------------------------------------------------------------------------------------------------------------------------------------------------------------------------------------------------- Physical Exam BP 135/71 (BP Location: Left Arm, Patient Position: Sitting, Cuff Size: Normal)   Pulse 92   Temp 98.1 F (36.7 C)   Wt 209 lb 9.6 oz (95.1 kg)   SpO2 98%   BMI 28.43 kg/m   Physical Exam Constitutional:      Appearance: Normal appearance.  HENT:     Head: Normocephalic and atraumatic.  Eyes:     General: No scleral icterus. Cardiovascular:     Rate and Rhythm: Normal rate and regular rhythm.  Pulmonary:     Effort: Pulmonary effort is normal.     Breath sounds: Normal breath sounds.  Musculoskeletal:     Cervical back: Neck supple.  Skin:    General: Skin is warm and dry.  Neurological:     General: No focal deficit present.     Mental Status: He is alert.  Psychiatric:        Mood and Affect: Mood normal.        Behavior: Behavior normal.     ------------------------------------------------------------------------------------------------------------------------------------------------------------------------------------------------------------------- Assessment and Plan  Inguinal pain Continued mild dysuria Previous UA normal.  No ureteral stones noted.  Check GC/Chlamydia.  He plans to follow up with urology as well if symptoms persist.    Depression with anxiety Doing well at increased fluoxetine dose.  Will plan to continue at current strength and follow up in 3 months.     No orders of the defined types were placed in this encounter.  Orders Placed This Encounter  Procedures  . Chlamydia/Neisseria Gonorrhoeae RNA,TMA,Urogenital  . Flu Vaccine QUAD 6+ mos PF IM (Fluarix Quad PF)    Return in about 3 months (around 06/02/2020) for Anxiety.    This visit occurred during the SARS-CoV-2 public health emergency.  Safety  protocols were in place, including screening questions prior to the visit, additional usage of staff PPE, and extensive cleaning of exam room while observing appropriate contact time as indicated for disinfecting solutions.

## 2020-03-02 NOTE — Patient Instructions (Signed)
Stop by the lab today to STD testing completed.  Continue fluoxetine for now.  See me again in 3 months.

## 2020-03-02 NOTE — Assessment & Plan Note (Signed)
Doing well at increased fluoxetine dose.  Will plan to continue at current strength and follow up in 3 months.

## 2020-03-04 LAB — CHLAMYDIA/NEISSERIA GONORRHOEAE RNA,TMA,UROGENTIAL
C. trachomatis RNA, TMA: NOT DETECTED
N. gonorrhoeae RNA, TMA: NOT DETECTED

## 2020-04-11 DIAGNOSIS — R3915 Urgency of urination: Secondary | ICD-10-CM | POA: Diagnosis not present

## 2020-04-11 DIAGNOSIS — R350 Frequency of micturition: Secondary | ICD-10-CM | POA: Diagnosis not present

## 2020-04-11 DIAGNOSIS — R351 Nocturia: Secondary | ICD-10-CM | POA: Diagnosis not present

## 2020-04-11 DIAGNOSIS — R300 Dysuria: Secondary | ICD-10-CM | POA: Diagnosis not present

## 2020-06-06 ENCOUNTER — Ambulatory Visit: Payer: BC Managed Care – PPO | Admitting: Family Medicine

## 2020-06-16 ENCOUNTER — Ambulatory Visit: Payer: BC Managed Care – PPO | Admitting: Family Medicine

## 2020-06-19 ENCOUNTER — Other Ambulatory Visit: Payer: Self-pay | Admitting: Family Medicine

## 2020-06-21 ENCOUNTER — Encounter: Payer: Self-pay | Admitting: Family Medicine

## 2020-06-21 ENCOUNTER — Ambulatory Visit (INDEPENDENT_AMBULATORY_CARE_PROVIDER_SITE_OTHER): Payer: BC Managed Care – PPO | Admitting: Family Medicine

## 2020-06-21 ENCOUNTER — Other Ambulatory Visit: Payer: Self-pay

## 2020-06-21 DIAGNOSIS — B078 Other viral warts: Secondary | ICD-10-CM

## 2020-06-21 DIAGNOSIS — F418 Other specified anxiety disorders: Secondary | ICD-10-CM

## 2020-06-21 DIAGNOSIS — B079 Viral wart, unspecified: Secondary | ICD-10-CM | POA: Insufficient documentation

## 2020-06-21 MED ORDER — FLUOXETINE HCL 40 MG PO CAPS: ORAL_CAPSULE | ORAL | 2 refills | Status: DC

## 2020-06-21 NOTE — Patient Instructions (Signed)
Continue with fluoxetine.  See me again in about 6 months.

## 2020-06-21 NOTE — Assessment & Plan Note (Signed)
Treated with liquid nitrogen today.  Tolerated well.  He will let me know if not resolving.

## 2020-06-21 NOTE — Assessment & Plan Note (Signed)
Doing well with fluoxetine.  Will continue at current strength. Follow up in 6 months.

## 2020-06-21 NOTE — Progress Notes (Signed)
Timothy Montes - 25 y.o. male MRN [id number]  Date of birth: 12-05-1995  Subjective Chief Complaint  Patient presents with  . Warts    HPI Timothy Montes is a 25 y.o. male here today for follow up of depression and anxiety.  Current management with fluoxetine.  He is doing really well at current strength.  He denies side effects related to this.    He also has wart on 5th digit of L hand.  Present for a few months.  Denies pain.  Would like to have this treated.   Depression screen [facility] 2/9 06/21/2020 03/02/2020  Decreased Interest 0 0  Down, Depressed, Hopeless 0 0  PHQ - 2 Score 0 0  Altered sleeping 1 1  Tired, decreased energy 0 0  Change in appetite 0 0  Feeling bad or failure about yourself  0 0  Trouble concentrating 0 0  Moving slowly or fidgety/restless 0 0  Suicidal thoughts 0 0  PHQ-9 Score 1 1  Difficult doing work/chores Not difficult at all Not difficult at all   GAD 7 : Generalized Anxiety Score 06/21/2020 03/02/2020  Nervous, Anxious, on Edge 0 0  Control/stop worrying 0 0  Worry too much - different things 0 0  Trouble relaxing 0 0  Restless 0 0  Easily annoyed or irritable 0 1  Afraid - awful might happen 0 0  Total GAD 7 Score 0 1  Anxiety Difficulty Not difficult at all Not difficult at all      ROS:  A comprehensive ROS was completed and negative except as noted per HPI  Allergies  Allergen Reactions  . Sulfa Antibiotics     Past Medical History:  Diagnosis Date  . Anxiety   . Kidney stone     Past Surgical History:  Procedure Laterality Date  . WISDOM TOOTH EXTRACTION      Social History   Socioeconomic History  . Marital status: Single    Spouse name: Not on file  . Number of children: Not on file  . Years of education: Not on file  . Highest education level: Not on file  Occupational History  . Not on file  Tobacco Use  . Smoking status: Former Smoker    Packs/day: 0.50    Years: 6.00    Pack years: 3.00    Types:  Cigarettes    Quit date: 02/06/2020    Years since quitting: 0.3  . Smokeless tobacco: Never Used  Vaping Use  . Vaping Use: Never used  Substance and Sexual Activity  . Alcohol use: Yes    Alcohol/week: 2.0 standard drinks    Types: 2 Cans of beer per week    Comment: weekly  . Drug use: No  . Sexual activity: Yes    Partners: Female  Other Topics Concern  . Not on file  Social History Narrative  . Not on file   Social Determinants of Health   Financial Resource Strain: Not on file  Food Insecurity: Not on file  Transportation Needs: Not on file  Physical Activity: Not on file  Stress: Not on file  Social Connections: Not on file    Family History  Problem Relation Age of Onset  . Healthy Mother   . Hypertension Father   . Heart attack Other   . Dementia Maternal Grandmother   . Thyroid disease Maternal Grandmother     Health Maintenance  Topic Date Due  . Hepatitis C Screening  Never done  .  COVID-19 Vaccine (1) Never done  . HIV Screening  Never done  . TETANUS/TDAP  12/16/2017  . INFLUENZA VACCINE  Completed     ----------------------------------------------------------------------------------------------------------------------------------------------------------------------------------------------------------------- Physical Exam BP (!) 143/79 (BP Location: Left Arm, Patient Position: Sitting, Cuff Size: Normal)   Pulse 75   Temp 98.3 F (36.8 C)   Wt 224 lb 9.6 oz (101.9 kg)   SpO2 100%   BMI 30.46 kg/m   Physical Exam Constitutional:      Appearance: Normal appearance.  HENT:     Head: Normocephalic and atraumatic.  Eyes:     General: No scleral icterus. Cardiovascular:     Rate and Rhythm: Normal rate and regular rhythm.  Pulmonary:     Effort: Pulmonary effort is normal.     Breath sounds: Normal breath sounds.  Musculoskeletal:     Cervical back: Neck supple.  Skin:    Comments: warth on 5th digit of L hand.   Neurological:      General: No focal deficit present.     Mental Status: He is alert.  Psychiatric:        Mood and Affect: Mood normal.        Behavior: Behavior normal.    Procedure:  Discussed treatment of wart with liquid nitrogen.  Procedure and potential complications reviewed.  He agrees to proceed.  Wart treated with liquid nitrogen x 3 freeze/ thaw cycles with 47mm frost ring on each cycle.  Tolerated well, post procedure instructions given.  ------------------------------------------------------------------------------------------------------------------------------------------------------------------------------------------------------------------- Assessment and Plan  Depression with anxiety Doing well with fluoxetine.  Will continue at current strength. Follow up in 6 months.   Wart Treated with liquid nitrogen today.  Tolerated well.  He will let me know if not resolving.    Meds ordered this encounter  Medications  . FLUoxetine (PROZAC) 40 MG capsule    Sig: TAKE 1 CAPSULE BY MOUTH EVERY DAY    Dispense:  90 capsule    Refill:  2    Return in about 6 months (around 12/19/2020) for Depression/anxiety.    This visit occurred during the SARS-CoV-2 public health emergency.  Safety protocols were in place, including screening questions prior to the visit, additional usage of staff PPE, and extensive cleaning of exam room while observing appropriate contact time as indicated for disinfecting solutions.

## 2020-12-19 ENCOUNTER — Ambulatory Visit: Payer: BC Managed Care – PPO | Admitting: Family Medicine

## 2021-04-21 ENCOUNTER — Telehealth: Payer: Self-pay | Admitting: General Practice

## 2021-04-21 NOTE — Telephone Encounter (Signed)
Transition Care Management Unsuccessful Follow-up Telephone Call  Date of discharge and from where:  04/20/21 from [facility]  Attempts:  1st Attempt  Reason for unsuccessful TCM follow-up call:  Left voice message    

## 2021-04-24 NOTE — Telephone Encounter (Signed)
Transition Care Management Unsuccessful Follow-up Telephone Call  Date of discharge and from where:  04/20/21 from [facility]  Attempts:  2nd Attempt  Reason for unsuccessful TCM follow-up call:  Left voice message    

## 2021-04-26 NOTE — Telephone Encounter (Signed)
Transition Care Management Unsuccessful Follow-up Telephone Call ° °Date of discharge and from where:  04/20/21 from [facility] ° °Attempts:  3rd Attempt ° °Reason for unsuccessful TCM follow-up call:  Left voice message ° °  °

## 2021-06-13 ENCOUNTER — Other Ambulatory Visit: Payer: Self-pay | Admitting: Family Medicine

## 2021-06-13 NOTE — Telephone Encounter (Signed)
LVM for patient to call back to schedule an appt for any further med refills. AM

## 2021-06-13 NOTE — Telephone Encounter (Signed)
Pls contact pt to schedule appt. Canceled 8/22 appt. No refills without appt.   Thanks

## 2021-09-28 ENCOUNTER — Encounter: Payer: Self-pay | Admitting: Family Medicine

## 2021-09-28 ENCOUNTER — Ambulatory Visit: Payer: Commercial Managed Care - PPO | Admitting: Family Medicine

## 2021-09-28 VITALS — BP 134/83 | HR 82 | Ht 72.0 in | Wt 241.0 lb

## 2021-09-28 DIAGNOSIS — Z23 Encounter for immunization: Secondary | ICD-10-CM | POA: Diagnosis not present

## 2021-09-28 DIAGNOSIS — K219 Gastro-esophageal reflux disease without esophagitis: Secondary | ICD-10-CM | POA: Diagnosis not present

## 2021-09-28 DIAGNOSIS — F418 Other specified anxiety disorders: Secondary | ICD-10-CM | POA: Diagnosis not present

## 2021-09-28 MED ORDER — FLUOXETINE HCL 20 MG PO TABS: 20.0000 mg | ORAL_TABLET | Freq: Every day | ORAL | 3 refills | Status: DC

## 2021-09-28 NOTE — Progress Notes (Signed)
Timothy Montes - 26 y.o. male MRN [id number]  Date of birth: 12/27/1995  Subjective Chief Complaint  Patient presents with   Follow-up    HPI Timothy Montes is a 26 y.o. male here today for follow up of depression.  Reports that he is doing well at this time.  Would like to decrease dose of fluoxetine.  No side effects at current dose, just doesn't feel like he needs a dose this strong.    He also has noted some reflux symptoms.  Hasn't tried anything so far.  Hasn't noted any certain foods that make this worse.   ROS:  A comprehensive ROS was completed and negative except as noted per HPI  Allergies  Allergen Reactions   Sulfa Antibiotics     Past Medical History:  Diagnosis Date   Anxiety    Kidney stone     Past Surgical History:  Procedure Laterality Date   WISDOM TOOTH EXTRACTION      Social History   Socioeconomic History   Marital status: Single    Spouse name: Not on file   Number of children: Not on file   Years of education: Not on file   Highest education level: Not on file  Occupational History   Not on file  Tobacco Use   Smoking status: Former    Packs/day: 0.50    Years: 6.00    Pack years: 3.00    Types: Cigarettes    Quit date: 02/06/2020    Years since quitting: 1.6   Smokeless tobacco: Never  Vaping Use   Vaping Use: Never used  Substance and Sexual Activity   Alcohol use: Yes    Alcohol/week: 2.0 standard drinks    Types: 2 Cans of beer per week    Comment: weekly   Drug use: No   Sexual activity: Yes    Partners: Female  Other Topics Concern   Not on file  Social History Narrative   Not on file   Social Determinants of Health   Financial Resource Strain: Not on file  Food Insecurity: Not on file  Transportation Needs: Not on file  Physical Activity: Not on file  Stress: Not on file  Social Connections: Not on file    Family History  Problem Relation Age of Onset   Healthy Mother    Hypertension Father    Heart attack  Other    Dementia Maternal Grandmother    Thyroid disease Maternal Grandmother     Health Maintenance  Topic Date Due   COVID-19 Vaccine (1) 01/12/2022 (Originally 05/14/1997)   Hepatitis C Screening  09/29/2022 (Originally 11/12/2014)   HIV Screening  09/29/2022 (Originally 11/12/2011)   INFLUENZA VACCINE  12/12/2021   TETANUS/TDAP  09/29/2031   HPV VACCINES  Completed     ----------------------------------------------------------------------------------------------------------------------------------------------------------------------------------------------------------------- Physical Exam BP 134/83 (BP Location: Left Arm, Patient Position: Sitting, Cuff Size: Normal)   Pulse 82   Ht 6' (1.829 m)   Wt 241 lb (109.3 kg)   SpO2 98%   BMI 32.69 kg/m   Physical Exam Constitutional:      Appearance: Normal appearance.  HENT:     Head: Normocephalic and atraumatic.  Eyes:     General: No scleral icterus. Musculoskeletal:     Cervical back: Neck supple.  Neurological:     General: No focal deficit present.     Mental Status: He is alert.  Psychiatric:        Mood and Affect: Mood normal.  Behavior: Behavior normal.    ------------------------------------------------------------------------------------------------------------------------------------------------------------------------------------------------------------------- Assessment and Plan  Depression with anxiety Would like to try decreasing strength of fluoxetine.  Reducing to 20mg  daily.  Return in about 6 months (around 03/31/2022) for f/u MDD.   GERD (gastroesophageal reflux disease) Recommend trying OTC pepcid initially.    Meds ordered this encounter  Medications   FLUoxetine (PROZAC) 20 MG tablet    Sig: Take 1 tablet (20 mg total) by mouth daily.    Dispense:  90 tablet    Refill:  3    Return in about 6 months (around 03/31/2022) for f/u MDD.    This visit occurred during the SARS-CoV-2  public health emergency.  Safety protocols were in place, including screening questions prior to the visit, additional usage of staff PPE, and extensive cleaning of exam room while observing appropriate contact time as indicated for disinfecting solutions.

## 2021-09-28 NOTE — Assessment & Plan Note (Signed)
Recommend trying OTC pepcid initially.

## 2021-09-28 NOTE — Assessment & Plan Note (Signed)
Would like to try decreasing strength of fluoxetine.  Reducing to 20mg  daily.  Return in about 6 months (around 03/31/2022) for f/u MDD.

## 2022-04-03 ENCOUNTER — Ambulatory Visit: Payer: Commercial Managed Care - PPO | Admitting: Family Medicine

## 2022-04-23 ENCOUNTER — Other Ambulatory Visit: Payer: Self-pay

## 2022-04-23 ENCOUNTER — Encounter: Payer: Self-pay | Admitting: Emergency Medicine

## 2022-04-23 ENCOUNTER — Ambulatory Visit
Admission: EM | Admit: 2022-04-23 | Discharge: 2022-04-23 | Disposition: A | Discharge status: Home / Self Care | Payer: Self-pay | Attending: Family Medicine | Admitting: Family Medicine

## 2022-04-23 DIAGNOSIS — J029 Acute pharyngitis, unspecified: Secondary | ICD-10-CM | POA: Insufficient documentation

## 2022-04-23 DIAGNOSIS — U071 COVID-19: Secondary | ICD-10-CM | POA: Insufficient documentation

## 2022-04-23 DIAGNOSIS — H6691 Otitis media, unspecified, right ear: Secondary | ICD-10-CM | POA: Insufficient documentation

## 2022-04-23 DIAGNOSIS — R059 Cough, unspecified: Secondary | ICD-10-CM

## 2022-04-23 LAB — RESP PANEL BY RT-PCR (FLU A&B, COVID) ARPGX2
Influenza A by PCR: NEGATIVE
Influenza B by PCR: NEGATIVE
SARS Coronavirus 2 by RT PCR: POSITIVE — AB

## 2022-04-23 LAB — POCT RAPID STREP A (OFFICE): Rapid Strep A Screen: NEGATIVE

## 2022-04-23 MED ORDER — AMOXICILLIN 875 MG PO TABS: 875.0000 mg | ORAL_TABLET | Freq: Two times a day (BID) | ORAL | 0 refills | Status: AC

## 2022-04-23 NOTE — Discharge Instructions (Addendum)
Advised patient to take medication as directed with food to completion.  Advised patient may take OTC Tylenol 1000 to 4000 mg daily, (1000 mg every 6 hours) as needed for fever.  Encourage patient to increase daily water intake to 64 ounces per day.  Advised we will follow-up with influenza/COVID-19 results once received.

## 2022-04-23 NOTE — ED Notes (Signed)
Patients tongue is completely white

## 2022-04-23 NOTE — ED Provider Notes (Signed)
[facility]    CSN: [id number] Arrival date & time: 04/23/22  1108      History   Chief Complaint Chief Complaint  Patient presents with   Cough    HPI Timothy Montes is a 26 y.o. male.   HPI 26 year old male presents with cough, headache, weakness, dizziness for 2.5 days.  Patient reports is on vaccinated for influenza and COVID-19.  PMH significant for anxiety and GERD.  Past Medical History:  Diagnosis Date   Anxiety    Kidney stone     Patient Active Problem List   Diagnosis Date Noted   GERD (gastroesophageal reflux disease) 09/28/2021   Wart 06/21/2020   Inguinal pain 03/02/2020   Depression with anxiety 12/31/2019   Right ureteral stone 01/27/2015    Past Surgical History:  Procedure Laterality Date   WISDOM TOOTH EXTRACTION         Home Medications    Prior to Admission medications   Medication Sig Start Date End Date Taking? Authorizing Provider  amoxicillin (AMOXIL) 875 MG tablet Take 1 tablet (875 mg total) by mouth 2 (two) times daily for 7 days. 04/23/22 04/30/22 Yes Trevor Iha, FNP    Family History Family History  Problem Relation Age of Onset   Healthy Mother    Hypertension Father    Heart attack Other    Dementia Maternal Grandmother    Thyroid disease Maternal Grandmother     Social History Social History   Tobacco Use   Smoking status: Former    Packs/day: 0.50    Years: 6.00    Total pack years: 3.00    Types: Cigarettes    Quit date: 02/06/2020    Years since quitting: 2.2   Smokeless tobacco: Never  Vaping Use   Vaping Use: Every day   Substances: Nicotine  Substance Use Topics   Alcohol use: Yes    Alcohol/week: 2.0 standard drinks of alcohol    Types: 2 Cans of beer per week    Comment: weekly   Drug use: No     Allergies   Sulfa antibiotics   Review of Systems Review of Systems  Respiratory:  Positive for cough.   Neurological:  Positive for weakness and headaches.  All other  systems reviewed and are negative.    Physical Exam Triage Vital Signs ED Triage Vitals [04/23/22 1143]  Enc Vitals Group     BP 131/85     Pulse Rate 99     Resp 18     Temp 99.5 F (37.5 C)     Temp Source Oral     SpO2 96 %     Weight 220 lb (99.8 kg)     Height 6' (1.829 m)     Head Circumference      Peak Flow      Pain Score 7     Pain Loc      Pain Edu?      Excl. in GC?    No data found.  Updated Vital Signs BP 131/85 (BP Location: Left Arm)   Pulse 99   Temp 99.5 F (37.5 C) (Oral)   Resp 18   Ht 6' (1.829 m)   Wt 220 lb (99.8 kg)   SpO2 96%   BMI 29.84 kg/m     Physical Exam Vitals and nursing note reviewed.  Constitutional:      Appearance: Normal appearance. He is normal weight.  HENT:     Head: Normocephalic and atraumatic.  Right Ear: Tympanic membrane, ear canal and external ear normal.     Left Ear: Tympanic membrane, ear canal and external ear normal.     Mouth/Throat:     Mouth: Mucous membranes are moist.     Pharynx: Oropharynx is clear.  Eyes:     Extraocular Movements: Extraocular movements intact.     Conjunctiva/sclera: Conjunctivae normal.     Pupils: Pupils are equal, round, and reactive to light.  Cardiovascular:     Rate and Rhythm: Normal rate and regular rhythm.     Pulses: Normal pulses.     Heart sounds: Normal heart sounds.  Pulmonary:     Effort: Pulmonary effort is normal.     Breath sounds: Normal breath sounds. No wheezing, rhonchi or rales.  Musculoskeletal:        General: Normal range of motion.     Cervical back: Normal range of motion and neck supple.  Skin:    General: Skin is warm and dry.  Neurological:     General: No focal deficit present.     Mental Status: He is alert and oriented to person, place, and time. Mental status is at baseline.      UC Treatments / Results  Labs (all labs ordered are listed, but only abnormal results are displayed) Labs Reviewed  RESP PANEL BY RT-PCR (FLU A&B,  COVID) ARPGX2  POCT RAPID STREP A (OFFICE)    EKG   Radiology No results found.  Procedures Procedures (including critical care time)  Medications Ordered in UC Medications - No data to display  Initial Impression / Assessment and Plan / UC Course  I have reviewed the triage vital signs and the nursing notes.  Pertinent labs & imaging results that were available during my care of the patient were reviewed by me and considered in my medical decision making (see chart for details).     MDM: 1.  Sore throat-rapid strep negative; 2.  Cough-Rx'd Tessalon,  lab [id number] ordered; 3.  Right otitis media. Advised patient to take medication as directed with food to completion.  Advised patient may take OTC Tylenol 1000 to 4000 mg daily, (1000 mg every 6 hours) as needed for fever.  Encouraged patient to increase daily water intake to 64 ounces per day.  Advised we will follow-up with influenza/COVID-19 results once received.  Work note provided to patient prior to discharge. Final Clinical Impressions(s) / UC Diagnoses   Final diagnoses:  Sore throat  Cough, unspecified type  Acute right otitis media     Discharge Instructions      Advised patient to take medication as directed with food to completion.  Advised patient may take OTC Tylenol 1000 to 4000 mg daily, (1000 mg every 6 hours) as needed for fever.  Encourage patient to increase daily water intake to 64 ounces per day.  Advised we will follow-up with influenza/COVID-19 results once received.     ED Prescriptions     Medication Sig Dispense Auth. Provider   amoxicillin (AMOXIL) 875 MG tablet Take 1 tablet (875 mg total) by mouth 2 (two) times daily for 7 days. 14 tablet Trevor Iha, FNP      PDMP not reviewed this encounter.   Trevor Iha, FNP 04/23/22 1253

## 2022-04-23 NOTE — ED Triage Notes (Signed)
Cough, headache, weakness, dizzy x 2.5 days Unvaccinated, 2 neg Covid tests at home

## 2022-04-24 ENCOUNTER — Telehealth: Payer: Self-pay | Admitting: Family Medicine

## 2022-04-24 ENCOUNTER — Encounter: Payer: Self-pay | Admitting: Family Medicine

## 2022-04-24 MED ORDER — MOLNUPIRAVIR EUA 200MG CAPSULE: 4.0000 | ORAL_CAPSULE | Freq: Two times a day (BID) | ORAL | 0 refills | Status: AC

## 2022-04-24 NOTE — Telephone Encounter (Signed)
[facility] if work note needed. Advised of pos covid results and that we are sending rx to pharmacy.

## 2022-04-24 NOTE — Telephone Encounter (Signed)
Patient advised of positive COVID-19 results.  Molnupiravir sent to patient's pharmacy.  Patient provided 5-day work excuse note for self quarantine/resolution of symptoms.

## 2022-04-24 NOTE — Telephone Encounter (Signed)
It appears there is a note in there for him to return to work on the 14.  He should be able to access through mychart.  If not, please print for him to pick up.  Thanks!

## 2023-01-02 ENCOUNTER — Encounter: Payer: Self-pay | Admitting: Emergency Medicine

## 2023-01-02 ENCOUNTER — Other Ambulatory Visit: Payer: Self-pay

## 2023-01-02 ENCOUNTER — Ambulatory Visit
Admission: EM | Admit: 2023-01-02 | Discharge: 2023-01-02 | Disposition: A | Discharge status: Home / Self Care | Payer: BC Managed Care – PPO | Attending: Family Medicine | Admitting: Family Medicine

## 2023-01-02 ENCOUNTER — Telehealth: Payer: Self-pay | Admitting: Emergency Medicine

## 2023-01-02 DIAGNOSIS — R197 Diarrhea, unspecified: Secondary | ICD-10-CM | POA: Diagnosis not present

## 2023-01-02 DIAGNOSIS — R109 Unspecified abdominal pain: Secondary | ICD-10-CM

## 2023-01-02 LAB — CBC WITH DIFFERENTIAL/PLATELET
Basophils Absolute: 0 10*3/uL (ref 0.0–0.2)
Basos: 0 %
EOS (ABSOLUTE): 0 10*3/uL (ref 0.0–0.4)
Eos: 1 %
Hematocrit: 42.3 % (ref 37.5–51.0)
Hemoglobin: 14.9 g/dL (ref 13.0–17.7)
Immature Granulocytes: 0 %
Lymphocytes Absolute: 2 10*3/uL (ref 0.7–3.1)
Lymphs: 29 %
MCH: 30.1 pg (ref 26.6–33.0)
MCHC: 35.2 g/dL (ref 31.5–35.7)
MCV: 86 fL (ref 79–97)
Monocytes Absolute: 0.4 10*3/uL (ref 0.1–0.9)
Monocytes: 5 %
Neutrophils Absolute: 4.5 10*3/uL (ref 1.4–7.0)
Neutrophils: 65 %
Platelets: 326 10*3/uL (ref 150–450)
RBC: 4.95 x10E6/uL (ref 4.14–5.80)
RDW: 11.8 % (ref 11.6–15.4)
WBC: 7 10*3/uL (ref 3.4–10.8)

## 2023-01-02 MED ORDER — DICYCLOMINE HCL 20 MG PO TABS: 20.0000 mg | ORAL_TABLET | Freq: Two times a day (BID) | ORAL | 0 refills | Status: DC

## 2023-01-02 NOTE — Discharge Instructions (Addendum)
Advised patient to increase daily water intake to 64 ounces per day 7 days/week.  Advised patient to push fluids with Gatorade G2 (low sugar version) or Gatorade 0 for cardiac electrolyte replenishment.  Advised patient not to use Bentyl until Friday or Saturday or if abdominal cramping worsens.  Advised we will follow-up with lab results once received.  Advised if symptoms worsen and/or unresolved please follow-up PCP or here for further evaluation.

## 2023-01-02 NOTE — Telephone Encounter (Signed)
Patient informed of normal lab results.  Patient voices understanding.  All questions answered.

## 2023-01-02 NOTE — ED Triage Notes (Signed)
Diarrhea woke patient this morning round 6 am.  Patient then fell back to sleep.  Reports 4 episodes of diarrhea.  Has not taken any medications for symptoms.

## 2023-01-02 NOTE — ED Provider Notes (Signed)
[facility]    CSN: [id number] Arrival date & time: 01/02/23  1306      History   Chief Complaint Chief Complaint  Patient presents with   Diarrhea    HPI Timothy Montes is a 27 y.o. male.   HPI 27 year old male presents with diarrhea upon awakening this morning at 6 AM.  Patient reports 4 episodes of diarrhea prior to this office visit.  Patient denies recent travel or exposure to sick contacts, exposure to new animals, or other sick contacts.  Patient reports eating fast food (cookout) early yesterday afternoon.  PMH significant for anxiety, obesity and GERD.  Past Medical History:  Diagnosis Date   Anxiety    Kidney stone     Patient Active Problem List   Diagnosis Date Noted   GERD (gastroesophageal reflux disease) 09/28/2021   Wart 06/21/2020   Inguinal pain 03/02/2020   Depression with anxiety 12/31/2019   Right ureteral stone 01/27/2015    Past Surgical History:  Procedure Laterality Date   WISDOM TOOTH EXTRACTION         Home Medications    Prior to Admission medications   Medication Sig Start Date End Date Taking? Authorizing Provider  dicyclomine (BENTYL) 20 MG tablet Take 1 tablet (20 mg total) by mouth 2 (two) times daily. 01/02/23  Yes Trevor Iha, FNP    Family History Family History  Problem Relation Age of Onset   Healthy Mother    Hypertension Father    Heart attack Other    Dementia Maternal Grandmother    Thyroid disease Maternal Grandmother     Social History Social History   Tobacco Use   Smoking status: Every Day    Current packs/day: 0.00    Average packs/day: 0.5 packs/day for 6.0 years (3.0 ttl pk-yrs)    Types: Cigarettes    Start date: 02/05/2014    Last attempt to quit: 02/06/2020    Years since quitting: 2.9   Smokeless tobacco: Never  Vaping Use   Vaping status: Former   Substances: Nicotine  Substance Use Topics   Alcohol use: Yes    Alcohol/week: 2.0 standard drinks of alcohol    Types: 2 Cans  of beer per week    Comment: weekly   Drug use: No     Allergies   Sulfa antibiotics   Review of Systems Review of Systems  Gastrointestinal:  Positive for diarrhea.  All other systems reviewed and are negative.    Physical Exam Triage Vital Signs ED Triage Vitals  Encounter Vitals Group     BP 01/02/23 1342 133/84     Systolic BP Percentile --      Diastolic BP Percentile --      Pulse Rate 01/02/23 1342 90     Resp 01/02/23 1342 18     Temp 01/02/23 1342 98.9 F (37.2 C)     Temp Source 01/02/23 1342 Oral     SpO2 01/02/23 1342 98 %     Weight --      Height --      Head Circumference --      Peak Flow --      Pain Score 01/02/23 1340 5     Pain Loc --      Pain Education --      Exclude from Growth Chart --    No data found.  Updated Vital Signs BP 133/84 (BP Location: Left Arm)   Pulse 90   Temp 98.9 F (37.2  C) (Oral)   Resp 18   SpO2 98%     Physical Exam Vitals and nursing note reviewed.  Constitutional:      General: He is not in acute distress.    Appearance: Normal appearance. He is obese. He is ill-appearing.  HENT:     Head: Normocephalic and atraumatic.     Mouth/Throat:     Mouth: Mucous membranes are moist.     Pharynx: Oropharynx is clear.  Eyes:     Extraocular Movements: Extraocular movements intact.     Conjunctiva/sclera: Conjunctivae normal.     Pupils: Pupils are equal, round, and reactive to light.  Cardiovascular:     Rate and Rhythm: Normal rate and regular rhythm.     Pulses: Normal pulses.     Heart sounds: Normal heart sounds.  Pulmonary:     Effort: Pulmonary effort is normal.     Breath sounds: Normal breath sounds. No wheezing, rhonchi or rales.  Musculoskeletal:        General: Normal range of motion.     Cervical back: Normal range of motion.  Skin:    General: Skin is warm and dry.     Coloration: Skin is pale.  Neurological:     General: No focal deficit present.     Mental Status: He is alert and  oriented to person, place, and time. Mental status is at baseline.  Psychiatric:        Mood and Affect: Mood normal.        Behavior: Behavior normal.        Thought Content: Thought content normal.        Judgment: Judgment normal.      UC Treatments / Results  Labs (all labs ordered are listed, but only abnormal results are displayed) Labs Reviewed  CBC WITH DIFFERENTIAL/PLATELET    EKG   Radiology No results found.  Procedures Procedures (including critical care time)  Medications Ordered in UC Medications - No data to display  Initial Impression / Assessment and Plan / UC Course  I have reviewed the triage vital signs and the nursing notes.  Pertinent labs & imaging results that were available during my care of the patient were reviewed by me and considered in my medical decision making (see chart for details).     MDM: 1.  Abdominal cramping-CBC with differential ordered stat; 2.  Diarrhea, unspecified type-Rx'd Bentyl (advised patient to hold this medication for the next 2 to 3 days). Advised patient to increase daily water intake to 64 ounces per day 7 days/week.  Advised patient to push fluids with Gatorade G2 (low sugar version) or Gatorade 0 for cardiac electrolyte replenishment.  Advised patient not to use Bentyl until Friday or Saturday or if abdominal cramping worsens.  Advised we will follow-up with lab results once received.  Advised if symptoms worsen and/or unresolved please follow-up PCP or here for further evaluation.  Work note provided to patient prior to discharge.  Patient discharged home, hemodynamically stable. Final Clinical Impressions(s) / UC Diagnoses   Final diagnoses:  Diarrhea, unspecified type  Abdominal cramping     Discharge Instructions      Advised patient to increase daily water intake to 64 ounces per day 7 days/week.  Advised patient to push fluids with Gatorade G2 (low sugar version) or Gatorade 0 for cardiac electrolyte  replenishment.  Advised patient not to use Bentyl until Friday or Saturday or if abdominal cramping worsens.  Advised we will follow-up with  lab results once received.  Advised if symptoms worsen and/or unresolved please follow-up PCP or here for further evaluation.     ED Prescriptions     Medication Sig Dispense Auth. Provider   dicyclomine (BENTYL) 20 MG tablet Take 1 tablet (20 mg total) by mouth 2 (two) times daily. 20 tablet Trevor Iha, FNP      PDMP not reviewed this encounter.   Trevor Iha, FNP 01/02/23 [id number]

## 2023-03-01 ENCOUNTER — Ambulatory Visit
Admission: RE | Admit: 2023-03-01 | Discharge: 2023-03-01 | Disposition: A | Discharge status: Home / Self Care | Payer: BC Managed Care – PPO | Source: Ambulatory Visit | Attending: Family Medicine | Admitting: Family Medicine

## 2023-03-01 DIAGNOSIS — S8991XA Unspecified injury of right lower leg, initial encounter: Secondary | ICD-10-CM | POA: Diagnosis not present

## 2023-03-01 DIAGNOSIS — S99911A Unspecified injury of right ankle, initial encounter: Secondary | ICD-10-CM

## 2023-03-01 NOTE — Discharge Instructions (Addendum)
Advised patient to RICE affected areas of right knee and right ankle for 30 minutes 3 times daily for the next 3 days.  Advised may take ibuprofen daily or as needed for pain.  Encouraged to increase daily water intake to 64 ounces per day while taking this medication.  Advised if symptoms worsen and/or unresolved please follow-up PCP or here for further evaluation.
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
## 2023-03-05 ENCOUNTER — Other Ambulatory Visit: Payer: Self-pay

## 2023-03-05 ENCOUNTER — Ambulatory Visit
Admission: EM | Admit: 2023-03-05 | Discharge: 2023-03-05 | Disposition: A | Discharge status: Home / Self Care | Payer: BC Managed Care – PPO | Attending: Family Medicine | Admitting: Family Medicine

## 2023-03-05 DIAGNOSIS — M542 Cervicalgia: Secondary | ICD-10-CM

## 2023-03-05 DIAGNOSIS — S0083XA Contusion of other part of head, initial encounter: Secondary | ICD-10-CM

## 2023-03-05 MED ORDER — TIZANIDINE HCL 4 MG PO TABS
4.0000 mg | ORAL_TABLET | Freq: Four times a day (QID) | ORAL | 0 refills | Status: DC | PRN
Start: 2023-03-05 — End: 2023-05-23

## 2023-03-05 MED ORDER — IBUPROFEN 800 MG PO TABS: 800.0000 mg | ORAL_TABLET | Freq: Three times a day (TID) | ORAL | 0 refills | Status: DC

## 2023-03-05 NOTE — Discharge Instructions (Signed)
Limit activity while neck and jaw are painful Use ice to painful areas Take ibuprofen 3 times a day with food Take tizanidine at bedtime.  This is a muscle relaxer Get plenty of rest.  Try going back to work in 2 to 3 days.  Call if unable to do so
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
## 2023-03-05 NOTE — ED Triage Notes (Signed)
C/o neck and left jaw pain since last night. Had a tooth removed a month ago to left bottom and feels pain to the area. States he feels dizzy and nauseous as well. No fevers.

## 2023-05-23 ENCOUNTER — Ambulatory Visit
Admission: EM | Admit: 2023-05-23 | Discharge: 2023-05-23 | Disposition: A | Discharge status: Home / Self Care | Payer: BC Managed Care – PPO | Attending: Family Medicine | Admitting: Family Medicine

## 2023-05-23 ENCOUNTER — Other Ambulatory Visit: Payer: Self-pay

## 2023-05-23 DIAGNOSIS — K529 Noninfective gastroenteritis and colitis, unspecified: Secondary | ICD-10-CM | POA: Diagnosis not present

## 2023-05-23 LAB — POC SARS CORONAVIRUS 2 AG -  ED: SARS Coronavirus 2 Ag: NEGATIVE

## 2023-05-23 LAB — POCT INFLUENZA A/B
Influenza A, POC: NEGATIVE
Influenza B, POC: NEGATIVE

## 2023-05-23 MED ORDER — ONDANSETRON HCL 8 MG PO TABS: 8.0000 mg | ORAL_TABLET | Freq: Three times a day (TID) | ORAL | 0 refills | Status: DC | PRN

## 2023-05-23 NOTE — ED Provider Notes (Signed)
 [name]    CSN: [id number] Arrival date & time: 05/23/23  1121      History   Chief Complaint Chief Complaint  Patient presents with   Diarrhea   Vomiting    HPI Timothy Montes is a 28 y.o. male.   Patient had some congestion but mostly vomiting and diarrhea for the last couple of days.  He missed work today.  He is keeping down some sips of water.  No food allergies except for hazelnuts, which he avoids.  He did not eat any food, no recent travel, no other reason for vomiting and diarrhea.    Past Medical History:  Diagnosis Date   Anxiety    Kidney stone     Patient Active Problem List   Diagnosis Date Noted   GERD (gastroesophageal reflux disease) 09/28/2021   Wart 06/21/2020   Inguinal pain 03/02/2020   Depression with anxiety 12/31/2019   Right ureteral stone 01/27/2015    Past Surgical History:  Procedure Laterality Date   WISDOM TOOTH EXTRACTION         Home Medications    Prior to Admission medications   Medication Sig Start Date End Date Taking? Authorizing Provider  ondansetron  (ZOFRAN ) 8 MG tablet Take 1 tablet (8 mg total) by mouth every 8 (eight) hours as needed for nausea or vomiting. 05/23/23  Yes Maranda Jamee Jacob, MD    Family History Family History  Problem Relation Age of Onset   Healthy Mother    Hypertension Father    Heart attack Other    Dementia Maternal Grandmother    Thyroid disease Maternal Grandmother     Social History Social History   Tobacco Use   Smoking status: Every Day    Current packs/day: 0.00    Average packs/day: 0.5 packs/day for 6.0 years (3.0 ttl pk-yrs)    Types: Cigarettes    Start date: 02/05/2014    Last attempt to quit: 02/06/2020    Years since quitting: 3.2   Smokeless tobacco: Never  Vaping Use   Vaping status: Former   Substances: Nicotine  Substance Use Topics   Alcohol use: Yes    Alcohol/week: 2.0 standard drinks of alcohol    Types: 2 Cans of beer per week    Comment:  weekly   Drug use: No     Allergies   Sulfa  antibiotics   Review of Systems Review of Systems See HPI  Physical Exam Triage Vital Signs ED Triage Vitals  Encounter Vitals Group     BP 05/23/23 1123 (!) 141/92     Systolic BP Percentile --      Diastolic BP Percentile --      Pulse Rate 05/23/23 1123 86     Resp 05/23/23 1123 16     Temp 05/23/23 1123 98.3 F (36.8 C)     Temp src --      SpO2 05/23/23 1123 97 %     Weight --      Height --      Head Circumference --      Peak Flow --      Pain Score 05/23/23 1126 4     Pain Loc --      Pain Education --      Exclude from Growth Chart --    No data found.  Updated Vital Signs BP (!) 141/92   Pulse 86   Temp 98.3 F (36.8 C)   Resp 16   SpO2 97%  Physical Exam Constitutional:      General: He is not in acute distress.    Appearance: Normal appearance. He is well-developed. He is ill-appearing.  HENT:     Head: Normocephalic and atraumatic.  Eyes:     Conjunctiva/sclera: Conjunctivae normal.     Pupils: Pupils are equal, round, and reactive to light.  Cardiovascular:     Rate and Rhythm: Normal rate.     Heart sounds: Normal heart sounds.  Pulmonary:     Effort: Pulmonary effort is normal. No respiratory distress.     Breath sounds: Normal breath sounds.  Abdominal:     General: There is no distension.     Palpations: Abdomen is soft.     Tenderness: There is no abdominal tenderness.  Musculoskeletal:        General: Normal range of motion.     Cervical back: Normal range of motion.  Skin:    General: Skin is warm and dry.  Neurological:     Mental Status: He is alert.      UC Treatments / Results  Labs (all labs ordered are listed, but only abnormal results are displayed) Labs Reviewed  POC SARS CORONAVIRUS 2 AG -  ED  POCT INFLUENZA A/B   Flu and COVID testing negative EKG   Radiology No results found.  Procedures Procedures (including critical care time)  Medications  Ordered in UC Medications - No data to display  Initial Impression / Assessment and Plan / UC Course  I have reviewed the triage vital signs and the nursing notes.  Pertinent labs & imaging results that were available during my care of the patient were reviewed by me and considered in my medical decision making (see chart for details).     Discussed recovery from viral gastroenteritis.  Importance of preventing dehydration Final Clinical Impressions(s) / UC Diagnoses   Final diagnoses:  Gastroenteritis     Discharge Instructions      It is important that you do not become dehydrated.  Take the Zofran  for nausea.  Drink lots of fluids.  Replacement fluids like Gatorade, Powerade, propel are good  The diarrhea should slow down on its own.  If you have persistent diarrhea you can take Imodium  You can eat bland foods.  Avoid spicy foods and fatty foods for a couple of days   ED Prescriptions     Medication Sig Dispense Auth. Provider   ondansetron  (ZOFRAN ) 8 MG tablet Take 1 tablet (8 mg total) by mouth every 8 (eight) hours as needed for nausea or vomiting. 20 tablet Maranda Jamee Jacob, MD      PDMP not reviewed this encounter.   Maranda Jamee Jacob, MD 05/23/23 1159

## 2023-05-23 NOTE — Discharge Instructions (Signed)
 It is important that you do not become dehydrated.  Take the Zofran  for nausea.  Drink lots of fluids.  Replacement fluids like Gatorade, Powerade, propel are good  The diarrhea should slow down on its own.  If you have persistent diarrhea you can take Imodium  You can eat bland foods.  Avoid spicy foods and fatty foods for a couple of days

## 2023-05-23 NOTE — ED Triage Notes (Addendum)
 Yesterday started having diarrhea, this morning woke up with headache, body aches, vomiting. Has mostly had diarrhea and headache. No fever. Has had tylenol.

## 2023-06-27 ENCOUNTER — Ambulatory Visit
Admission: EM | Admit: 2023-06-27 | Discharge: 2023-06-27 | Disposition: A | Discharge status: Home / Self Care | Payer: BC Managed Care – PPO | Attending: Family Medicine | Admitting: Family Medicine

## 2023-06-27 DIAGNOSIS — R109 Unspecified abdominal pain: Secondary | ICD-10-CM

## 2023-06-27 DIAGNOSIS — M545 Low back pain, unspecified: Secondary | ICD-10-CM | POA: Diagnosis not present

## 2023-06-27 LAB — POCT URINALYSIS DIP (MANUAL ENTRY)
Bilirubin, UA: NEGATIVE
Blood, UA: NEGATIVE
Glucose, UA: NEGATIVE mg/dL
Ketones, POC UA: NEGATIVE mg/dL
Leukocytes, UA: NEGATIVE
Nitrite, UA: NEGATIVE
Spec Grav, UA: >=1.03 — AB (ref 1.010–1.025)
Urobilinogen, UA: 0.2 U/dL
pH, UA: 5.5 (ref 5.0–8.0)

## 2023-06-27 MED ORDER — IBUPROFEN 800 MG PO TABS: 800.0000 mg | ORAL_TABLET | Freq: Three times a day (TID) | ORAL | 0 refills | Status: DC | PRN

## 2023-06-27 MED ORDER — TIZANIDINE HCL 4 MG PO TABS: 4.0000 mg | ORAL_TABLET | Freq: Every evening | ORAL | 0 refills | Status: DC | PRN

## 2023-06-27 NOTE — Discharge Instructions (Signed)
The urinalysis did not have any blood on it.  Take ibuprofen 800 mg--1 tab every 8 hours as needed for pain.   Take tizanidine 4 mg--1 at bedtime as needed for muscle spasms; this medication can cause dizziness and sleepiness  Please follow-up with your primary care about this issue

## 2023-06-27 NOTE — ED Provider Notes (Signed)
[facility]    CSN: [id number] Arrival date & time: 06/27/23  1013      History   Chief Complaint Chief Complaint  Patient presents with   Back Pain    HPI Timothy Montes is a 28 y.o. male.    Back Pain Here for low back pain it has been bothering him for about 2 weeks.  No trauma or fall.  He will get a little better and then get a little worse.  It has possibly even gotten completely better for a little bit.  It starts in his upper lumbar region in the central part of his back and can radiate to the left.  No radiation into his groin.  No fever or chills and no dysuria or hematuria.  He states this does not feel like when he has had kidney stones in the past.  It can be affected by lying down too long and seems to be worse often when he is first getting up in the morning.  It also can bother him enough that it makes him nauseated.  Otherwise no vomiting or diarrhea and no upper respiratory symptoms.  He is allergic to sulfa    Past Medical History:  Diagnosis Date   Anxiety    Kidney stone     Patient Active Problem List   Diagnosis Date Noted   GERD (gastroesophageal reflux disease) 09/28/2021   Wart 06/21/2020   Inguinal pain 03/02/2020   Depression with anxiety 12/31/2019   Right ureteral stone 01/27/2015    Past Surgical History:  Procedure Laterality Date   WISDOM TOOTH EXTRACTION         Home Medications    Prior to Admission medications   Medication Sig Start Date End Date Taking? Authorizing Provider  ibuprofen (ADVIL) 800 MG tablet Take 1 tablet (800 mg total) by mouth every 8 (eight) hours as needed (pain). 06/27/23  Yes Zenia Resides, MD  tiZANidine (ZANAFLEX) 4 MG tablet Take 1 tablet (4 mg total) by mouth at bedtime as needed for muscle spasms. 06/27/23  Yes Zenia Resides, MD    Family History Family History  Problem Relation Age of Onset   Healthy Mother    Hypertension Father    Heart attack Other    Dementia  Maternal Grandmother    Thyroid disease Maternal Grandmother     Social History Social History   Tobacco Use   Smoking status: Every Day    Current packs/day: 0.00    Average packs/day: 0.5 packs/day for 6.0 years (3.0 ttl pk-yrs)    Types: Cigarettes    Start date: 02/05/2014    Last attempt to quit: 02/06/2020    Years since quitting: 3.3   Smokeless tobacco: Never  Vaping Use   Vaping status: Former   Substances: Nicotine  Substance Use Topics   Alcohol use: Yes    Alcohol/week: 2.0 standard drinks of alcohol    Types: 2 Cans of beer per week    Comment: weekly   Drug use: No     Allergies   Sulfa antibiotics   Review of Systems Review of Systems  Musculoskeletal:  Positive for back pain.     Physical Exam Triage Vital Signs ED Triage Vitals  Encounter Vitals Group     BP 06/27/23 1021 133/89     Systolic BP Percentile --      Diastolic BP Percentile --      Pulse Rate 06/27/23 1021 99  Resp 06/27/23 1021 17     Temp 06/27/23 1021 97.7 F (36.5 C)     Temp Source 06/27/23 1021 Oral     SpO2 06/27/23 1021 97 %     Weight --      Height --      Head Circumference --      Peak Flow --      Pain Score 06/27/23 1022 6     Pain Loc --      Pain Education --      Exclude from Growth Chart --    No data found.  Updated Vital Signs BP 133/89 (BP Location: Right Arm)   Pulse 99   Temp 97.7 F (36.5 C) (Oral)   Resp 17   SpO2 97%   Visual Acuity Right Eye Distance:   Left Eye Distance:   Bilateral Distance:    Right Eye Near:   Left Eye Near:    Bilateral Near:     Physical Exam Vitals reviewed.  Constitutional:      General: He is not in acute distress.    Appearance: He is not toxic-appearing.  HENT:     Mouth/Throat:     Mouth: Mucous membranes are moist.  Eyes:     Extraocular Movements: Extraocular movements intact.     Pupils: Pupils are equal, round, and reactive to light.  Cardiovascular:     Rate and Rhythm: Normal rate and  regular rhythm.     Heart sounds: No murmur heard. Pulmonary:     Effort: Pulmonary effort is normal. No respiratory distress.     Breath sounds: Normal breath sounds. No stridor. No wheezing, rhonchi or rales.  Abdominal:     Palpations: Abdomen is soft.     Tenderness: There is no abdominal tenderness. There is no right CVA tenderness or left CVA tenderness.  Musculoskeletal:        General: No tenderness.     Cervical back: Neck supple.  Lymphadenopathy:     Cervical: No cervical adenopathy.  Skin:    Capillary Refill: Capillary refill takes less than 2 seconds.     Coloration: Skin is not jaundiced or pale.     Findings: No rash.  Neurological:     General: No focal deficit present.     Mental Status: He is alert and oriented to person, place, and time.  Psychiatric:        Behavior: Behavior normal.      UC Treatments / Results  Labs (all labs ordered are listed, but only abnormal results are displayed) Labs Reviewed  POCT URINALYSIS DIP (MANUAL ENTRY) - Abnormal; Notable for the following components:      Result Value   Spec Grav, UA >=1.030 (*)    Protein Ur, POC trace (*)    All other components within normal limits    EKG   Radiology No results found.  Procedures Procedures (including critical care time)  Medications Ordered in UC Medications - No data to display  Initial Impression / Assessment and Plan / UC Course  I have reviewed the triage vital signs and the nursing notes.  Pertinent labs & imaging results that were available during my care of the patient were reviewed by me and considered in my medical decision making (see chart for details).     Urinalysis is negative for any red blood cells or white blood cells or nitrites.  Ibuprofen and tizanidine are sent in for pain and muscle spasm.  He will  follow-up with his primary care.  He is given back stretching exercises. Final Clinical Impressions(s) / UC Diagnoses   Final diagnoses:   Acute left-sided low back pain without sciatica  Left flank pain     Discharge Instructions      The urinalysis did not have any blood on it.  Take ibuprofen 800 mg--1 tab every 8 hours as needed for pain.   Take tizanidine 4 mg--1 at bedtime as needed for muscle spasms; this medication can cause dizziness and sleepiness  Please follow-up with your primary care about this issue      ED Prescriptions     Medication Sig Dispense Auth. Provider   ibuprofen (ADVIL) 800 MG tablet Take 1 tablet (800 mg total) by mouth every 8 (eight) hours as needed (pain). 21 tablet Jaydian Santana, Janace Aris, MD   tiZANidine (ZANAFLEX) 4 MG tablet Take 1 tablet (4 mg total) by mouth at bedtime as needed for muscle spasms. 15 tablet [place] Rabenold, Janace Aris, MD      PDMP not reviewed this encounter.   Zenia Resides, MD 06/27/23 1055

## 2023-06-27 NOTE — ED Triage Notes (Signed)
Pt c/o intermittent lower back pain x 2 weeks. Denies injury. Tylenol prn.

## 2023-07-03 ENCOUNTER — Ambulatory Visit
Admission: EM | Admit: 2023-07-03 | Discharge: 2023-07-03 | Disposition: A | Discharge status: Home / Self Care | Payer: BC Managed Care – PPO | Attending: Family Medicine | Admitting: Family Medicine

## 2023-07-03 ENCOUNTER — Encounter: Payer: Self-pay | Admitting: Emergency Medicine

## 2023-07-03 DIAGNOSIS — J069 Acute upper respiratory infection, unspecified: Secondary | ICD-10-CM

## 2023-07-03 LAB — POCT RAPID STREP A (OFFICE): Rapid Strep A Screen: NEGATIVE

## 2023-07-03 MED ORDER — DOXYCYCLINE HYCLATE 100 MG PO CAPS: ORAL_CAPSULE | ORAL | 0 refills | Status: DC

## 2023-07-03 NOTE — ED Triage Notes (Signed)
Pt presents to UC with c/o a sore throat that started on Monday, and  a cough and nasal congestion that began last night. Denies body aches and fever. Denies taking any OTC medication for relief.

## 2023-07-03 NOTE — Discharge Instructions (Signed)
Take plain guaifenesin (1200mg  extended release tabs such as Mucinex) twice daily, with plenty of water, for cough and congestion.  May add Pseudoephedrine (30mg , one or two every 4 to 6 hours) for sinus congestion.  Get adequate rest.   May use Afrin nasal spray (or generic oxymetazoline) each morning for about 5 days and then discontinue.  Also recommend using saline nasal spray several times daily and saline nasal irrigation (AYR is a common brand).  Use Flonase nasal spray each morning after using Afrin nasal spray and saline nasal irrigation. Try warm salt water gargles for sore throat.  Avoid all antihistamines for now, and other non-prescription cough/cold preparations. May take Delsym Cough Suppressant ("12 Hour Cough Relief") at bedtime for nighttime cough.  May take Ibuprofen 200mg , 4 tabs every 8 hours with food for sore throat. Begin Doxycycline if not improving about five days or if persistent fever develops

## 2023-07-03 NOTE — ED Provider Notes (Signed)
[facility]    CSN: [id number] Arrival date & time: 07/03/23  1115      History   Chief Complaint Chief Complaint  Patient presents with   Cough   Sore Throat   Nasal Congestion    HPI Timothy Montes is a 28 y.o. male.   Patient complains of three day history of typical cold-like symptoms developing over several days, including sore throat, sinus congestion, fatigue, and cough.  He had an episode of nausea/vomiting initially, resolved.  He denies fever, shortness of breath, and fevers, chills, and sweats.  The history is provided by the patient.    Past Medical History:  Diagnosis Date   Anxiety    Kidney stone     Patient Active Problem List   Diagnosis Date Noted   GERD (gastroesophageal reflux disease) 09/28/2021   Wart 06/21/2020   Inguinal pain 03/02/2020   Depression with anxiety 12/31/2019   Right ureteral stone 01/27/2015    Past Surgical History:  Procedure Laterality Date   WISDOM TOOTH EXTRACTION         Home Medications    Prior to Admission medications   Medication Sig Start Date End Date Taking? Authorizing Provider  doxycycline (VIBRAMYCIN) 100 MG capsule Take one cap PO Q12hr with food. 07/03/23  Yes Lattie Haw, MD  ibuprofen (ADVIL) 800 MG tablet Take 1 tablet (800 mg total) by mouth every 8 (eight) hours as needed (pain). 06/27/23   Zenia Resides, MD  tiZANidine (ZANAFLEX) 4 MG tablet Take 1 tablet (4 mg total) by mouth at bedtime as needed for muscle spasms. 06/27/23   Zenia Resides, MD    Family History Family History  Problem Relation Age of Onset   Healthy Mother    Hypertension Father    Heart attack Other    Dementia Maternal Grandmother    Thyroid disease Maternal Grandmother     Social History Social History   Tobacco Use   Smoking status: Every Day    Current packs/day: 0.00    Average packs/day: 0.5 packs/day for 6.0 years (3.0 ttl pk-yrs)    Types: Cigarettes    Start date: 02/05/2014     Last attempt to quit: 02/06/2020    Years since quitting: 3.4   Smokeless tobacco: Never  Vaping Use   Vaping status: Former   Substances: Nicotine  Substance Use Topics   Alcohol use: Yes    Alcohol/week: 2.0 standard drinks of alcohol    Types: 2 Cans of beer per week    Comment: weekly   Drug use: No     Allergies   Sulfa antibiotics   Review of Systems Review of Systems + sore throat + cough No pleuritic pain No wheezing + nasal congestion + post-nasal drainage No sinus pain/pressure No itchy/red eyes No earache No hemoptysis No SOB No fever/chills + nausea, resolved + vomiting, resolved No abdominal pain No diarrhea No urinary symptoms No skin rash + fatigue + mild myalgias No headache    Physical Exam Triage Vital Signs ED Triage Vitals [07/03/23 1127]  Encounter Vitals Group     BP (!) 139/93     Systolic BP Percentile      Diastolic BP Percentile      Pulse Rate 84     Resp 18     Temp 97.6 F (36.4 C)     Temp Source Oral     SpO2 98 %     Weight  Height      Head Circumference      Peak Flow      Pain Score 5     Pain Loc      Pain Education      Exclude from Growth Chart    No data found.  Updated Vital Signs BP (!) 139/93 (BP Location: Left Arm)   Pulse 84   Temp 97.6 F (36.4 C) (Oral)   Resp 18   SpO2 98%   Visual Acuity Right Eye Distance:   Left Eye Distance:   Bilateral Distance:    Right Eye Near:   Left Eye Near:    Bilateral Near:     Physical Exam Nursing notes and Vital Signs reviewed. Appearance:  Patient appears stated age, and in no acute distress Eyes:  Pupils are equal, round, and reactive to light and accomodation.  Extraocular movement is intact.  Conjunctivae are not inflamed  Ears:  Canals normal.  Tympanic membranes normal.  Nose:  Mildly congested turbinates.  No sinus tenderness.  Pharynx:  Normal Neck:  Supple.  No adenopathy. Lungs:  Clear to auscultation.  Breath sounds are equal.   Moving air well. Heart:  Regular rate and rhythm without murmurs, rubs, or gallops.  Abdomen:  Nontender without masses or hepatosplenomegaly.  Bowel sounds are present.  No CVA or flank tenderness.  Extremities:  No edema.  Skin:  No rash present.   UC Treatments / Results  Labs (all labs ordered are listed, but only abnormal results are displayed) Labs Reviewed  POCT RAPID STREP A (OFFICE) negative    EKG   Radiology No results found.  Procedures Procedures (including critical care time)  Medications Ordered in UC Medications - No data to display  Initial Impression / Assessment and Plan / UC Course  I have reviewed the triage vital signs and the nursing notes.  Pertinent labs & imaging results that were available during my care of the patient were reviewed by me and considered in my medical decision making (see chart for details).    Benign exam.  There is no evidence of bacterial infection today.  Treat symptomatically for now. If not improving about 5 days or persistent fever develops, begin doxycycline (Given a prescription to hold, with an expiration date)  Follow-up with family doctor if not improving about10 days.  Final Clinical Impressions(s) / UC Diagnoses   Final diagnoses:  Viral URI with cough     Discharge Instructions      Take plain guaifenesin (1200mg  extended release tabs such as Mucinex) twice daily, with plenty of water, for cough and congestion.  May add Pseudoephedrine (30mg , one or two every 4 to 6 hours) for sinus congestion.  Get adequate rest.   May use Afrin nasal spray (or generic oxymetazoline) each morning for about 5 days and then discontinue.  Also recommend using saline nasal spray several times daily and saline nasal irrigation (AYR is a common brand).  Use Flonase nasal spray each morning after using Afrin nasal spray and saline nasal irrigation. Try warm salt water gargles for sore throat.  Avoid all antihistamines for now, and  other non-prescription cough/cold preparations. May take Delsym Cough Suppressant ("12 Hour Cough Relief") at bedtime for nighttime cough.  May take Ibuprofen 200mg , 4 tabs every 8 hours with food for sore throat. Begin Doxycycline if not improving about five days or if persistent fever develops     ED Prescriptions     Medication Sig Dispense Auth. Provider  doxycycline (VIBRAMYCIN) 100 MG capsule Take one cap PO Q12hr with food. 14 capsule Lattie Haw, MD         Lattie Haw, MD 07/03/23 [phone]

## 2024-02-24 ENCOUNTER — Ambulatory Visit: Admitting: Family Medicine

## 2024-02-24 ENCOUNTER — Encounter: Payer: Self-pay | Admitting: Family Medicine

## 2024-02-24 VITALS — BP 122/87 | HR 111 | Ht 72.0 in | Wt 243.0 lb

## 2024-02-24 DIAGNOSIS — R200 Anesthesia of skin: Secondary | ICD-10-CM | POA: Insufficient documentation

## 2024-02-24 DIAGNOSIS — K219 Gastro-esophageal reflux disease without esophagitis: Secondary | ICD-10-CM

## 2024-02-24 DIAGNOSIS — R0789 Other chest pain: Secondary | ICD-10-CM | POA: Diagnosis not present

## 2024-02-24 MED ORDER — PANTOPRAZOLE SODIUM 40 MG PO TBEC: 40.0000 mg | DELAYED_RELEASE_TABLET | Freq: Every day | ORAL | 3 refills | Status: DC

## 2024-02-24 NOTE — Assessment & Plan Note (Signed)
 Started pantoprazole 40 mg daily.  Follow-up in 6 weeks.

## 2024-02-24 NOTE — Assessment & Plan Note (Signed)
 He does have a positive Tinel's at [place].  We discussed workstation modifications and better ergonomics to improve this initially.

## 2024-02-24 NOTE — Assessment & Plan Note (Signed)
 Some of his symptoms may be related to GERD.  Some tenderness at the sternochondral junction.  Checking vitamin D levels.

## 2024-02-24 NOTE — Progress Notes (Signed)
 Timothy Montes - 28 y.o. male MRN [id number]  Date of birth: 1996/03/20  Subjective Chief Complaint  Patient presents with   Chest Pain   Numbness    HPI Timothy Montes is a 28 y.o. male here today with complaint of pain under the L rib cage area.  He is also having some tingling in the 4th and 5th digits of the L hand.    Tingling in 4th and 5th digits of L hand started a few months ago.  He did start a new job where he is sitting at  a desk more often.  He denies pain at the elbow.  Mild pain at the wrist.  Has not tried any modifications of his workstation.  Denies any weakness of grip strength.  He has not had any neck pain.  He is having some pain under the left rib cage area as well as epigastric area some radiation to his back.  Pain.  He has had some increased reflux symptoms.  He has not tried anything so far for management of these.  Describes pain as sharp and intermittent.  Symptoms typically lasts a few seconds to a minute.  ROS:  A comprehensive ROS was completed and negative except as noted per HPI  Allergies  Allergen Reactions   Sulfa  Antibiotics     Past Medical History:  Diagnosis Date   Anxiety    Kidney stone     Past Surgical History:  Procedure Laterality Date   WISDOM TOOTH EXTRACTION      Social History   Socioeconomic History   Marital status: Single    Spouse name: Not on file   Number of children: Not on file   Years of education: Not on file   Highest education level: Not on file  Occupational History   Not on file  Tobacco Use   Smoking status: Every Day    Current packs/day: 0.00    Average packs/day: 0.5 packs/day for 6.0 years (3.0 ttl pk-yrs)    Types: Cigarettes    Start date: 02/05/2014    Last attempt to quit: 02/06/2020    Years since quitting: 4.0   Smokeless tobacco: Never  Vaping Use   Vaping status: Former   Substances: Nicotine  Substance and Sexual Activity   Alcohol use: Yes    Alcohol/week: 2.0 standard drinks of  alcohol    Types: 2 Cans of beer per week    Comment: weekly   Drug use: No   Sexual activity: Yes    Partners: Female  Other Topics Concern   Not on file  Social History Narrative   Not on file   Social Drivers of Health   Financial Resource Strain: Low Risk  (09/07/2021)   Received from [facility]   Overall Financial Resource Strain (CARDIA)    Difficulty of Paying Living Expenses: Not hard at all  Food Insecurity: No Food Insecurity (09/07/2021)   Received from [facility]   Hunger Vital Sign    Within the past 12 months, you worried that your food would run out before you got the money to buy more.: Never true    Within the past 12 months, the food you bought just didn't last and you didn't have money to get more.: Never true  Transportation Needs: Not on file  Physical Activity: Sufficiently Active (09/07/2021)   Received from [facility]   Exercise Vital Sign    On average, how many days per week do you engage  in moderate to strenuous exercise (like a brisk walk)?: 5 days    On average, how many minutes do you engage in exercise at this level?: 60 min  Stress: No Stress Concern Present (09/07/2021)   Received from [facility] - Occupational Stress Questionnaire    Feeling of Stress : Not at all  Social Connections: Unknown (09/17/2022)   Received from [facility]   Social Network    Social Network: Not on file    Family History  Problem Relation Age of Onset   Healthy Mother    Hypertension Father    Heart attack Other    Dementia Maternal Grandmother    Thyroid disease Maternal Grandmother     Health Maintenance  Topic Date Due   Pneumococcal Vaccine (2 of 2 - PPSV23, PCV20, or PCV21) 11/12/2002   HIV Screening  Never done   Hepatitis C Screening  Never done   Meningococcal B Vaccine (2 of 2 - Bexsero SCDM 2-dose series) 07/06/2017   Influenza Vaccine  12/13/2023   COVID-19 Vaccine (1 - 2025-26 season) Never  done   DTaP/Tdap/Td (8 - Td or Tdap) 09/29/2031   Hepatitis B Vaccines 19-59 Average Risk  Completed   HPV VACCINES  Completed     ----------------------------------------------------------------------------------------------------------------------------------------------------------------------------------------------------------------- Physical Exam BP 129/86 (BP Location: Left Arm, Patient Position: Sitting, Cuff Size: Large)   Pulse (!) 111   Ht 6' (1.829 m)   Wt 243 lb (110.2 kg)   SpO2 96%   BMI 32.96 kg/m   Physical Exam Constitutional:      Appearance: He is well-developed.  HENT:     Head: Normocephalic and atraumatic.  Eyes:     General: No scleral icterus. Cardiovascular:     Rate and Rhythm: Normal rate and regular rhythm.  Pulmonary:     Effort: Pulmonary effort is normal.     Breath sounds: Normal breath sounds.     Comments: Tenderness to palpation along the sternal chondral junction Neurological:     Mental Status: He is alert.  Psychiatric:        Mood and Affect: Mood normal.        Behavior: Behavior normal.     ------------------------------------------------------------------------------------------------------------------------------------------------------------------------------------------------------------------- Assessment and Plan  GERD (gastroesophageal reflux disease) Started pantoprazole 40 mg daily.  Follow-up in 6 weeks.  Chest wall pain Some of his symptoms may be related to GERD.  Some tenderness at the sternochondral junction.  Checking vitamin D levels.  Finger numbness He does have a positive Tinel's at [place].  We discussed workstation modifications and better ergonomics to improve this initially.   No orders of the defined types were placed in this encounter.   No follow-ups on file.

## 2024-02-24 NOTE — Patient Instructions (Signed)
 Start pantoprazole daily.  We'll be in touch with lab results.  See me again in 6-8 weeks.

## 2024-02-25 LAB — VITAMIN B12: Vitamin B-12: 535 pg/mL (ref 232–1245)

## 2024-02-25 LAB — VITAMIN D 25 HYDROXY (VIT D DEFICIENCY, FRACTURES): Vit D, 25-Hydroxy: 18.1 ng/mL — ABNORMAL LOW (ref 30.0–100.0)

## 2024-03-06 ENCOUNTER — Ambulatory Visit: Payer: Self-pay | Admitting: Family Medicine

## 2024-03-06 MED ORDER — VITAMIN D (ERGOCALCIFEROL) 1.25 MG (50000 UNIT) PO CAPS: 50000.0000 [IU] | ORAL_CAPSULE | ORAL | 0 refills | Status: AC

## 2024-05-24 ENCOUNTER — Other Ambulatory Visit: Payer: Self-pay | Admitting: Family Medicine
